# Patient Record
Sex: Female | Born: 1985 | Race: Black or African American | Hispanic: No | Marital: Single | Smoking: Former smoker
Health system: Southern US, Community
[De-identification: ages and names within clinical notes are randomized; demographics above are authoritative.]

## PROBLEM LIST (undated history)

## (undated) DIAGNOSIS — A6000 Herpesviral infection of urogenital system, unspecified: Secondary | ICD-10-CM

## (undated) DIAGNOSIS — G4700 Insomnia, unspecified: Secondary | ICD-10-CM

## (undated) DIAGNOSIS — E663 Overweight: Secondary | ICD-10-CM

## (undated) DIAGNOSIS — E282 Polycystic ovarian syndrome: Secondary | ICD-10-CM

## (undated) HISTORY — DX: Herpesviral infection of urogenital system, unspecified: A60.00

## (undated) HISTORY — DX: Insomnia, unspecified: G47.00

## (undated) HISTORY — DX: Overweight: E66.3

## (undated) HISTORY — DX: Polycystic ovarian syndrome: E28.2

---
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
## 2012-08-12 ENCOUNTER — Emergency Department: Payer: Self-pay | Admitting: Emergency Medicine

## 2012-08-12 LAB — COMPREHENSIVE METABOLIC PANEL
Alkaline Phosphatase: 104 U/L (ref 50–136)
BUN: 9 mg/dL (ref 7–18)
Bilirubin,Total: 0.4 mg/dL (ref 0.2–1.0)
Chloride: 106 mmol/L (ref 98–107)
Creatinine: 0.68 mg/dL (ref 0.60–1.30)
EGFR (African American): >60
Potassium: 3.7 mmol/L (ref 3.5–5.1)
SGPT (ALT): 32 U/L (ref 12–78)
Total Protein: 8.3 g/dL — ABNORMAL HIGH (ref 6.4–8.2)

## 2012-08-12 LAB — URINALYSIS, COMPLETE
Nitrite: NEGATIVE
Ph: 6 (ref 4.5–8.0)
Protein: NEGATIVE
Specific Gravity: 1.024 (ref 1.003–1.030)
WBC UR: 3 /HPF (ref 0–5)

## 2012-08-12 LAB — CBC
HCT: 37.2 % (ref 35.0–47.0)
HGB: 13.1 g/dL (ref 12.0–16.0)
MCH: 29.9 pg (ref 26.0–34.0)
MCV: 85 fL (ref 80–100)
Platelet: 259 10*3/uL (ref 150–440)
RBC: 4.39 10*6/uL (ref 3.80–5.20)
WBC: 12.3 10*3/uL — ABNORMAL HIGH (ref 3.6–11.0)

## 2012-12-03 LAB — HM PAP SMEAR: HM PAP: NEGATIVE

## 2013-06-03 ENCOUNTER — Emergency Department: Payer: Self-pay | Admitting: Emergency Medicine

## 2013-06-06 LAB — BETA STREP CULTURE(ARMC)

## 2013-08-30 ENCOUNTER — Emergency Department: Payer: Self-pay | Admitting: Emergency Medicine

## 2013-11-19 ENCOUNTER — Emergency Department: Payer: Self-pay | Admitting: Emergency Medicine

## 2013-11-19 LAB — COMPREHENSIVE METABOLIC PANEL
ALK PHOS: 88 U/L
Albumin: 3.4 g/dL (ref 3.4–5.0)
Anion Gap: 6 — ABNORMAL LOW (ref 7–16)
BUN: 11 mg/dL (ref 7–18)
Bilirubin,Total: 0.4 mg/dL (ref 0.2–1.0)
CO2: 27 mmol/L (ref 21–32)
Calcium, Total: 8.6 mg/dL (ref 8.5–10.1)
Chloride: 106 mmol/L (ref 98–107)
Creatinine: 0.82 mg/dL (ref 0.60–1.30)
EGFR (African American): >60
GLUCOSE: 108 mg/dL — AB (ref 65–99)
Osmolality: 277 (ref 275–301)
POTASSIUM: 4.2 mmol/L (ref 3.5–5.1)
SGOT(AST): 17 U/L (ref 15–37)
SGPT (ALT): 19 U/L (ref 12–78)
Sodium: 139 mmol/L (ref 136–145)
Total Protein: 7.8 g/dL (ref 6.4–8.2)

## 2013-11-19 LAB — URINALYSIS, COMPLETE
BILIRUBIN, UR: NEGATIVE
Blood: NEGATIVE
Glucose,UR: NEGATIVE mg/dL (ref 0–75)
KETONE: NEGATIVE
Nitrite: NEGATIVE
PH: 6 (ref 4.5–8.0)
PROTEIN: NEGATIVE
RBC,UR: 4 /HPF (ref 0–5)
Specific Gravity: 1.021 (ref 1.003–1.030)
WBC UR: 10 /HPF (ref 0–5)

## 2013-11-19 LAB — CBC WITH DIFFERENTIAL/PLATELET
BASOS ABS: 0.1 10*3/uL (ref 0.0–0.1)
Basophil %: 0.8 %
EOS ABS: 0.1 10*3/uL (ref 0.0–0.7)
EOS PCT: 0.8 %
HCT: 36.9 % (ref 35.0–47.0)
HGB: 12.6 g/dL (ref 12.0–16.0)
LYMPHS PCT: 23.5 %
Lymphocyte #: 3.6 10*3/uL (ref 1.0–3.6)
MCH: 28.8 pg (ref 26.0–34.0)
MCHC: 34.1 g/dL (ref 32.0–36.0)
MCV: 84 fL (ref 80–100)
Monocyte #: 0.7 x10 3/mm (ref 0.2–0.9)
Monocyte %: 4.7 %
Neutrophil #: 10.7 10*3/uL — ABNORMAL HIGH (ref 1.4–6.5)
Neutrophil %: 70.2 %
PLATELETS: 251 10*3/uL (ref 150–440)
RBC: 4.37 10*6/uL (ref 3.80–5.20)
RDW: 14.9 % — ABNORMAL HIGH (ref 11.5–14.5)
WBC: 15.3 10*3/uL — AB (ref 3.6–11.0)

## 2013-11-19 LAB — HCG, QUANTITATIVE, PREGNANCY: Beta Hcg, Quant.: 52 m[IU]/mL — ABNORMAL HIGH

## 2013-11-28 ENCOUNTER — Emergency Department: Payer: Self-pay | Admitting: Emergency Medicine

## 2013-12-15 ENCOUNTER — Emergency Department: Payer: Self-pay | Admitting: Emergency Medicine

## 2013-12-15 LAB — CBC
HCT: 37 % (ref 35.0–47.0)
HGB: 12.2 g/dL (ref 12.0–16.0)
MCH: 28 pg (ref 26.0–34.0)
MCHC: 32.9 g/dL (ref 32.0–36.0)
MCV: 85 fL (ref 80–100)
Platelet: 268 10*3/uL (ref 150–440)
RBC: 4.35 10*6/uL (ref 3.80–5.20)
RDW: 15.4 % — AB (ref 11.5–14.5)
WBC: 13 10*3/uL — AB (ref 3.6–11.0)

## 2013-12-15 LAB — URINALYSIS, COMPLETE
BILIRUBIN, UR: NEGATIVE
Glucose,UR: NEGATIVE mg/dL (ref 0–75)
Ketone: NEGATIVE
Leukocyte Esterase: NEGATIVE
NITRITE: NEGATIVE
PH: 7 (ref 4.5–8.0)
PROTEIN: NEGATIVE
RBC,UR: 1 /HPF (ref 0–5)
SPECIFIC GRAVITY: 1.012 (ref 1.003–1.030)

## 2013-12-15 LAB — HCG, QUANTITATIVE, PREGNANCY: BETA HCG, QUANT.: 726 m[IU]/mL — AB

## 2014-05-06 ENCOUNTER — Emergency Department: Payer: Self-pay | Admitting: Emergency Medicine

## 2014-06-09 ENCOUNTER — Emergency Department: Payer: Self-pay | Admitting: Emergency Medicine

## 2014-06-09 LAB — URINALYSIS, COMPLETE
BACTERIA: NONE SEEN
Bilirubin,UR: NEGATIVE
GLUCOSE, UR: NEGATIVE mg/dL (ref 0–75)
Ketone: NEGATIVE
Leukocyte Esterase: NEGATIVE
NITRITE: NEGATIVE
Ph: 5 (ref 4.5–8.0)
Protein: NEGATIVE
SPECIFIC GRAVITY: 1.023 (ref 1.003–1.030)

## 2014-06-09 LAB — CBC WITH DIFFERENTIAL/PLATELET
Basophil #: 0.1 10*3/uL (ref 0.0–0.1)
Basophil %: 0.7 %
Eosinophil #: 0.2 10*3/uL (ref 0.0–0.7)
Eosinophil %: 1.5 %
HCT: 38.1 % (ref 35.0–47.0)
HGB: 12.9 g/dL (ref 12.0–16.0)
LYMPHS ABS: 3.2 10*3/uL (ref 1.0–3.6)
Lymphocyte %: 27.5 %
MCH: 28.6 pg (ref 26.0–34.0)
MCHC: 33.7 g/dL (ref 32.0–36.0)
MCV: 85 fL (ref 80–100)
Monocyte #: 0.7 x10 3/mm (ref 0.2–0.9)
Monocyte %: 5.7 %
Neutrophil #: 7.6 10*3/uL — ABNORMAL HIGH (ref 1.4–6.5)
Neutrophil %: 64.6 %
PLATELETS: 259 10*3/uL (ref 150–440)
RBC: 4.5 10*6/uL (ref 3.80–5.20)
RDW: 14.5 % (ref 11.5–14.5)
WBC: 11.7 10*3/uL — AB (ref 3.6–11.0)

## 2014-06-09 LAB — GC/CHLAMYDIA PROBE AMP

## 2014-06-09 LAB — WET PREP, GENITAL

## 2015-01-23 IMAGING — CR DG CHEST 2V
1 series · 2 of 2 positions shown · non-contrast
Comparison: None.

CLINICAL DATA: Cough and fever for 6 days

EXAM:
CHEST  2 VIEW

[Series 1: pa · 0.17mm/px · 2 of 2 slices shown]
[im 1/2]
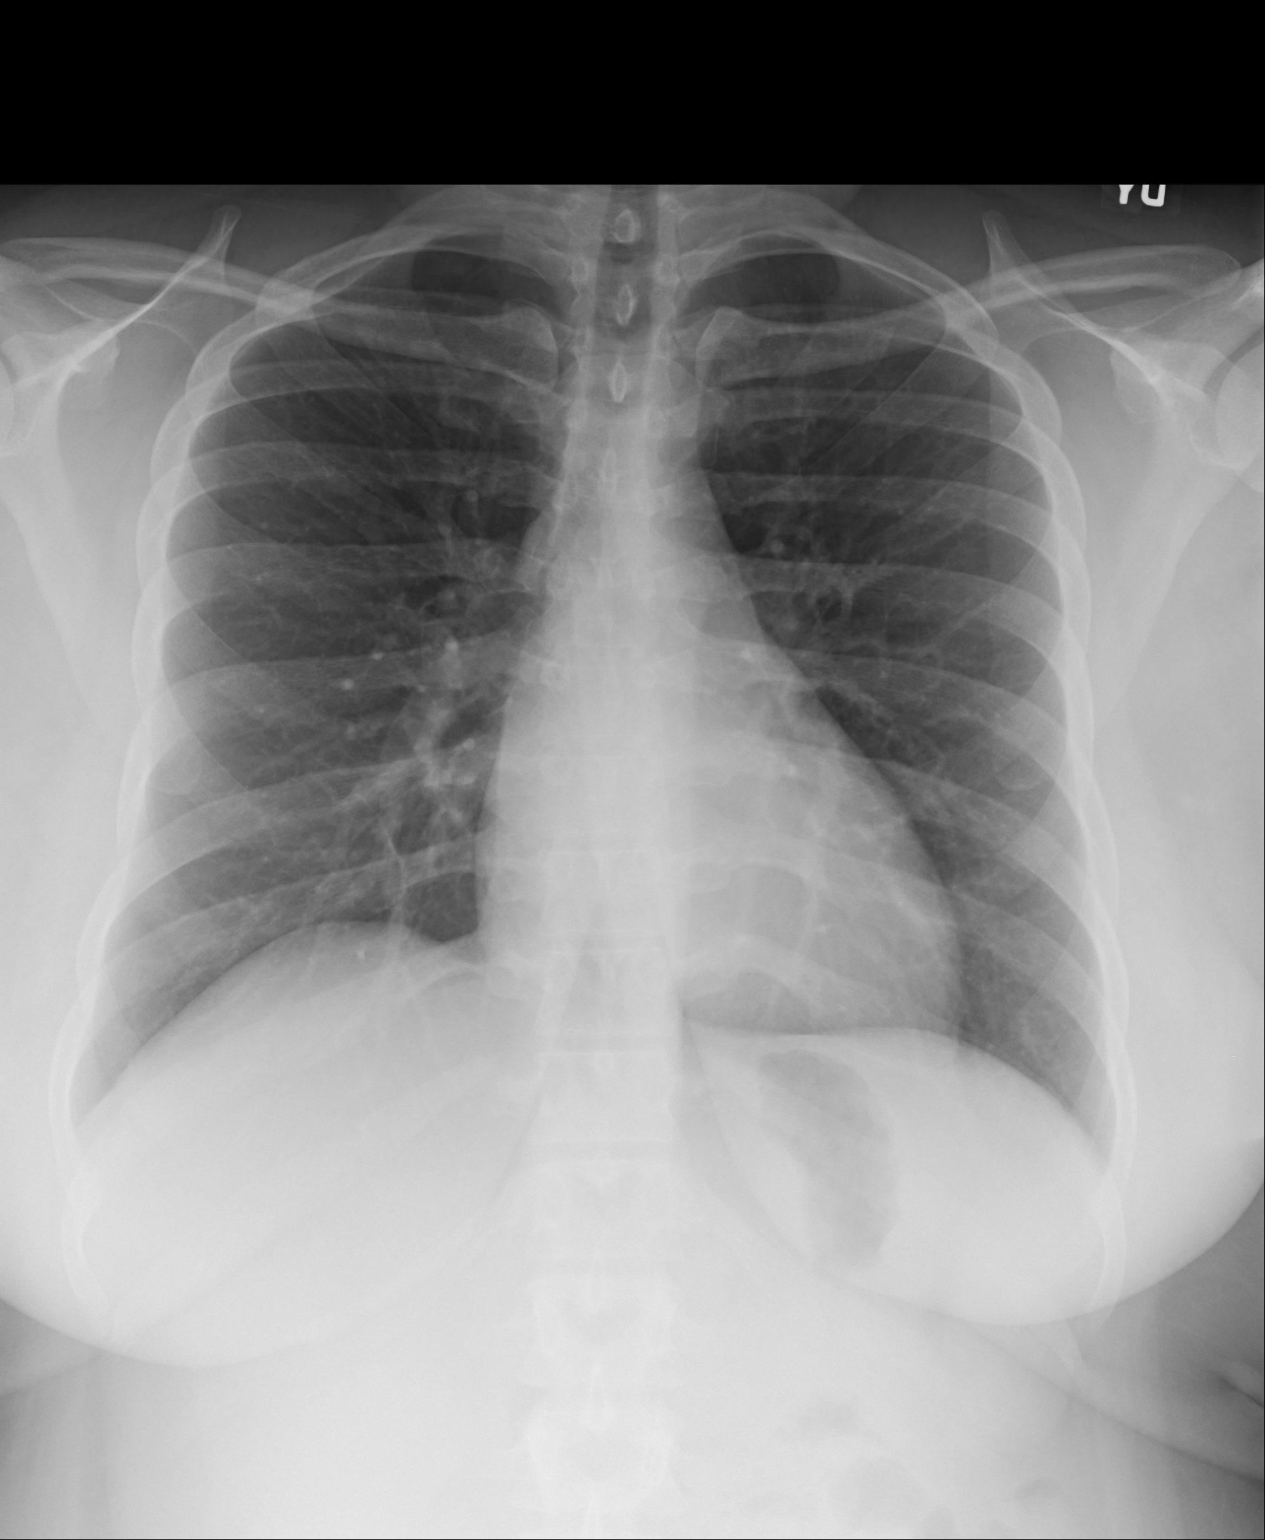
[im 2/2]
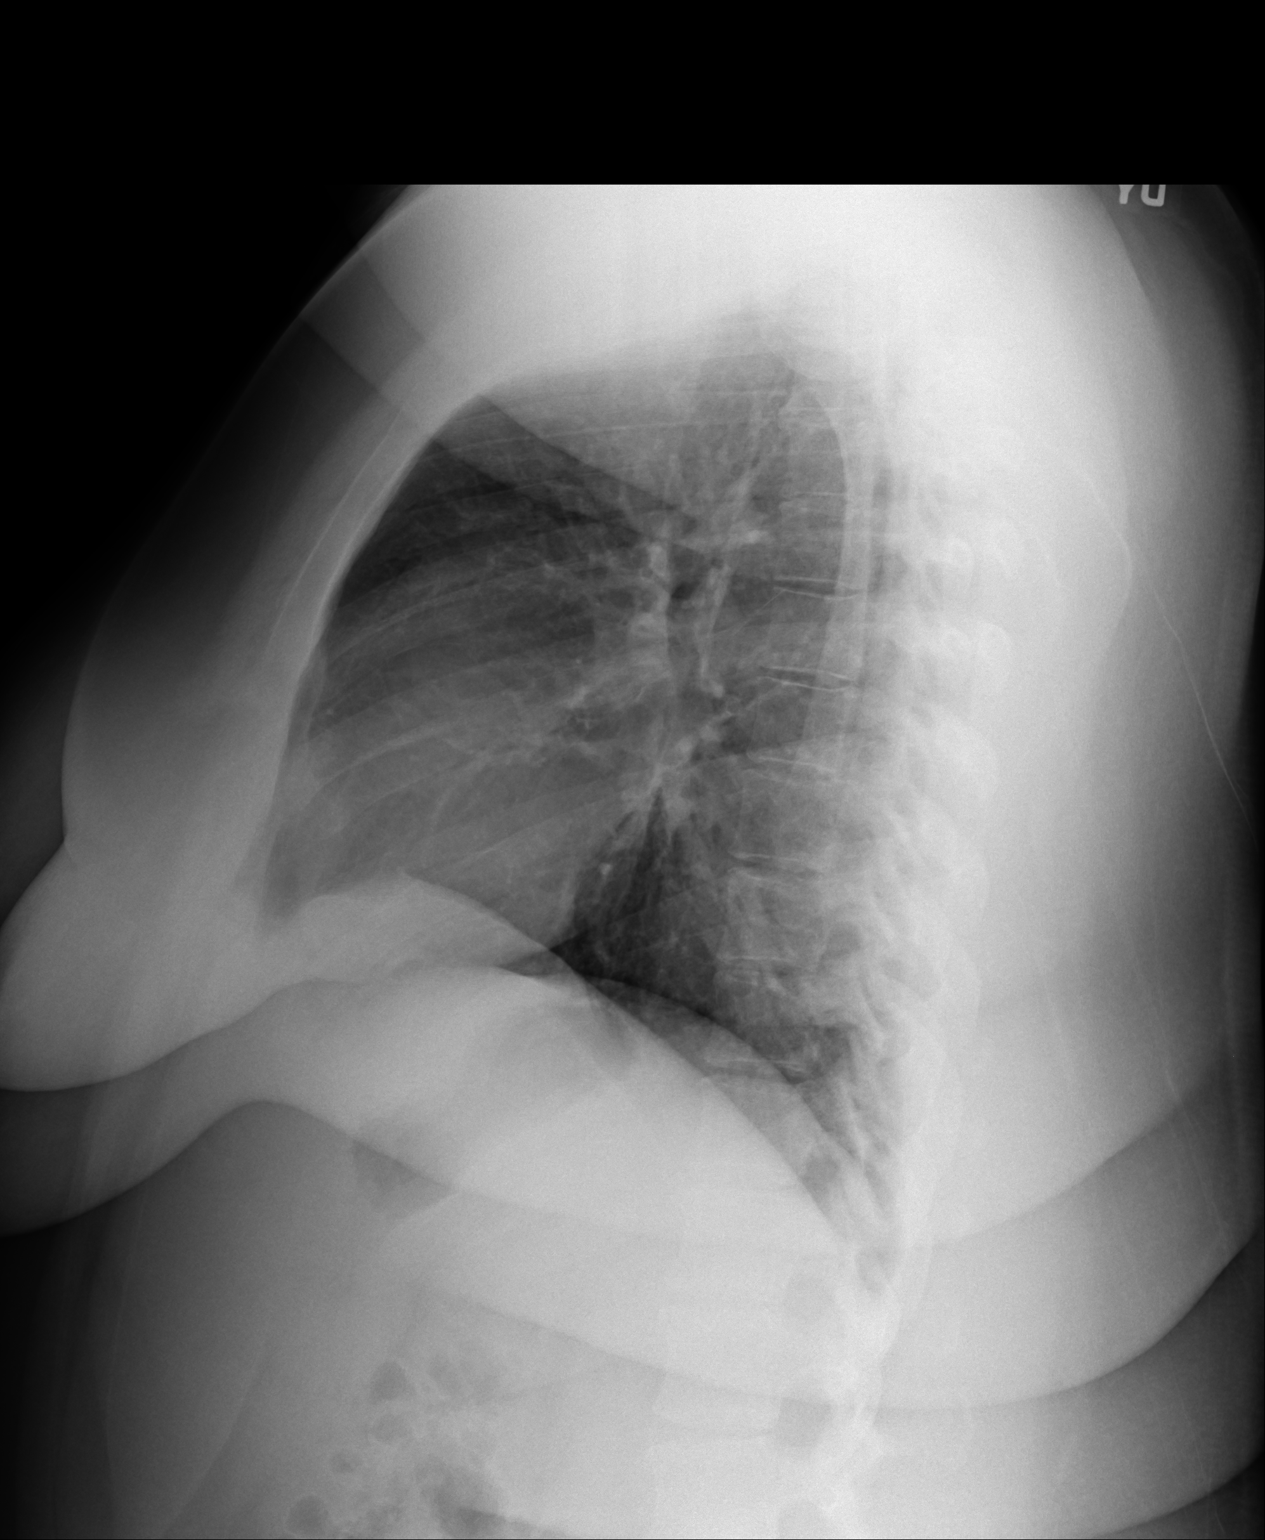

[2 of 2 positions shown; findings below may reference images not displayed]

FINDINGS: The heart size and mediastinal contours are within normal limits.
Both lungs are clear. The visualized skeletal structures are
unremarkable.
IMPRESSION: No active cardiopulmonary disease.

## 2015-04-15 ENCOUNTER — Emergency Department
Admission: EM | Admit: 2015-04-15 | Discharge: 2015-04-15 | Disposition: A | Discharge status: Home / Self Care | Payer: Self-pay | Attending: Emergency Medicine | Admitting: Emergency Medicine

## 2015-04-15 ENCOUNTER — Emergency Department: Payer: Self-pay

## 2015-04-15 DIAGNOSIS — N730 Acute parametritis and pelvic cellulitis: Secondary | ICD-10-CM | POA: Insufficient documentation

## 2015-04-15 DIAGNOSIS — R102 Pelvic and perineal pain: Secondary | ICD-10-CM

## 2015-04-15 DIAGNOSIS — Z720 Tobacco use: Secondary | ICD-10-CM | POA: Insufficient documentation

## 2015-04-15 DIAGNOSIS — Z3202 Encounter for pregnancy test, result negative: Secondary | ICD-10-CM | POA: Insufficient documentation

## 2015-04-15 DIAGNOSIS — B379 Candidiasis, unspecified: Secondary | ICD-10-CM

## 2015-04-15 DIAGNOSIS — B373 Candidiasis of vulva and vagina: Secondary | ICD-10-CM | POA: Insufficient documentation

## 2015-04-15 LAB — URINALYSIS COMPLETE WITH MICROSCOPIC (ARMC ONLY)
BILIRUBIN URINE: NEGATIVE
Glucose, UA: NEGATIVE mg/dL
Hgb urine dipstick: NEGATIVE
KETONES UR: NEGATIVE mg/dL
Leukocytes, UA: NEGATIVE
NITRITE: NEGATIVE
PROTEIN: NEGATIVE mg/dL
Specific Gravity, Urine: 1.026 (ref 1.005–1.030)
pH: 6 (ref 5.0–8.0)

## 2015-04-15 LAB — CBC
HCT: 39.3 % (ref 35.0–47.0)
HEMOGLOBIN: 13.2 g/dL (ref 12.0–16.0)
MCH: 28.1 pg (ref 26.0–34.0)
MCHC: 33.5 g/dL (ref 32.0–36.0)
MCV: 83.7 fL (ref 80.0–100.0)
Platelets: 255 10*3/uL (ref 150–440)
RBC: 4.69 MIL/uL (ref 3.80–5.20)
RDW: 14.9 % — AB (ref 11.5–14.5)
WBC: 14.5 10*3/uL — ABNORMAL HIGH (ref 3.6–11.0)

## 2015-04-15 LAB — WET PREP, GENITAL
CLUE CELLS WET PREP: NONE SEEN
TRICH WET PREP: NONE SEEN

## 2015-04-15 LAB — BASIC METABOLIC PANEL
Anion gap: 7 (ref 5–15)
BUN: 11 mg/dL (ref 6–20)
CALCIUM: 9.1 mg/dL (ref 8.9–10.3)
CHLORIDE: 106 mmol/L (ref 101–111)
CO2: 26 mmol/L (ref 22–32)
CREATININE: 0.89 mg/dL (ref 0.44–1.00)
GFR calc non Af Amer: >60 mL/min (ref >60)
Glucose, Bld: 166 mg/dL — ABNORMAL HIGH (ref 65–99)
Potassium: 3.8 mmol/L (ref 3.5–5.1)
SODIUM: 139 mmol/L (ref 135–145)

## 2015-04-15 LAB — CHLAMYDIA/NGC RT PCR (ARMC ONLY)
CHLAMYDIA TR: NOT DETECTED
N GONORRHOEAE: NOT DETECTED

## 2015-04-15 LAB — PREGNANCY, URINE: Preg Test, Ur: NEGATIVE

## 2015-04-15 MED ORDER — FLUCONAZOLE 50 MG PO TABS
150.0000 mg | ORAL_TABLET | Freq: Once | ORAL | Status: AC
  Administered 2015-04-15: 150 mg via ORAL
  Filled 2015-04-15: qty 1

## 2015-04-15 MED ORDER — CEFTRIAXONE SODIUM 250 MG IJ SOLR
250.0000 mg | Freq: Once | INTRAMUSCULAR | Status: AC
  Administered 2015-04-15: 250 mg via INTRAMUSCULAR
  Filled 2015-04-15: qty 250

## 2015-04-15 MED ORDER — LIDOCAINE HCL (PF) 1 % IJ SOLN
0.9000 mL | Freq: Once | INTRAMUSCULAR | Status: AC
  Administered 2015-04-15: 0.9 mL

## 2015-04-15 MED ORDER — ONDANSETRON 4 MG PO TBDP
4.0000 mg | ORAL_TABLET | Freq: Once | ORAL | Status: AC
  Administered 2015-04-15: 4 mg via ORAL
  Filled 2015-04-15: qty 1

## 2015-04-15 MED ORDER — OXYCODONE-ACETAMINOPHEN 5-325 MG PO TABS
1.0000 | ORAL_TABLET | Freq: Once | ORAL | Status: AC
Start: 2015-04-15 — End: 2015-04-15
  Administered 2015-04-15: 1 via ORAL
  Filled 2015-04-15: qty 1

## 2015-04-15 MED ORDER — LIDOCAINE HCL (PF) 1 % IJ SOLN
INTRAMUSCULAR | Status: AC
  Administered 2015-04-15: 0.9 mL
  Filled 2015-04-15: qty 5

## 2015-04-15 MED ORDER — AZITHROMYCIN 1 G PO PACK
1.0000 g | PACK | Freq: Once | ORAL | Status: AC
  Administered 2015-04-15: 1 g via ORAL
  Filled 2015-04-15: qty 1

## 2015-04-15 MED ORDER — DOXYCYCLINE HYCLATE 50 MG PO CAPS: 100.0000 mg | ORAL_CAPSULE | Freq: Two times a day (BID) | ORAL | Status: AC

## 2015-04-15 NOTE — Discharge Instructions (Signed)
Pelvic Inflammatory Disease °Pelvic inflammatory disease (PID) refers to an infection in some or all of the female organs. The infection can be in the uterus, ovaries, fallopian tubes, or the surrounding tissues in the pelvis. PID can cause abdominal or pelvic pain that comes on suddenly (acute pelvic pain). PID is a serious infection because it can lead to lasting (chronic) pelvic pain or the inability to have children (infertile).  °CAUSES  °The infection is often caused by the normal bacteria found in the vaginal tissues. PID may also be caused by an infection that is spread during sexual contact. PID can also occur following:  °· The birth of a baby.   °· A miscarriage.   °· An abortion.   °· Major pelvic surgery.   °· The use of an intrauterine device (IUD).   °· A sexual assault.   °RISK FACTORS °Certain factors can put a person at higher risk for PID, such as: °· Being younger than 25 years. °· Being sexually active at a young age. °· Using nonbarrier contraception. °· Having multiple sexual partners. °· Having sex with someone who has symptoms of a genital infection. °· Using oral contraception. °Other times, certain behaviors can increase the possibility of getting PID, such as: °· Having sex during your period. °· Using a vaginal douche. °· Having an intrauterine device (IUD) in place. °SYMPTOMS  °· Abdominal or pelvic pain.   °· Fever.   °· Chills.   °· Abnormal vaginal discharge. °· Abnormal uterine bleeding.   °· Unusual pain shortly after finishing your period. °DIAGNOSIS  °Your caregiver will choose some of the following methods to make a diagnosis, such as:  °· Performing a physical exam and history. A pelvic exam typically reveals a very tender uterus and surrounding pelvis.   °· Ordering laboratory tests including a pregnancy test, blood tests, and urine test.  °· Ordering cultures of the vagina and cervix to check for a sexually transmitted infection (STI). °· Performing an ultrasound.    °· Performing a laparoscopic procedure to look inside the pelvis.   °TREATMENT  °· Antibiotic medicines may be prescribed and taken by mouth.   °· Sexual partners may be treated when the infection is caused by a sexually transmitted disease (STD).   °· Hospitalization may be needed to give antibiotics intravenously. °· Surgery may be needed, but this is rare. °It may take weeks until you are completely well. If you are diagnosed with PID, you should also be checked for human immunodeficiency virus (HIV).   °HOME CARE INSTRUCTIONS  °· If given, take your antibiotics as directed. Finish the medicine even if you start to feel better.   °· Only take over-the-counter or prescription medicines for pain, discomfort, or fever as directed by your caregiver.   °· Do not have sexual intercourse until treatment is completed or as directed by your caregiver. If PID is confirmed, your recent sexual partner(s) will need treatment.   °· Keep your follow-up appointments. °SEEK MEDICAL CARE IF:  °· You have increased or abnormal vaginal discharge.   °· You need prescription medicine for your pain.   °· You vomit.   °· You cannot take your medicines.   °· Your partner has an STD.   °SEEK IMMEDIATE MEDICAL CARE IF:  °· You have a fever.   °· You have increased abdominal or pelvic pain.   °· You have chills.   °· You have pain when you urinate.   °· You are not better after 72 hours following treatment.   °MAKE SURE YOU:  °· Understand these instructions. °· Will watch your condition. °· Will get help right away if you are not doing well or get worse. °  Document Released: 08/14/2005 Document Revised: 12/09/2012 Document Reviewed: 08/10/2011 °[employer]® Patient Information ©2015 [employer]. This information is not intended to replace advice given to you by your health care provider. Make sure you discuss any questions you have with your health care provider. ° °

## 2015-04-15 NOTE — ED Notes (Signed)
Pt in with co pelvic pain radiates to rectal area worse during intercourse.

## 2015-04-15 NOTE — ED Provider Notes (Signed)
[facility] Emergency Department Provider Note  ____________________________________________  Time seen: 3:08 AM  I have reviewed the triage vital signs and the nursing notes.   HISTORY  Chief Complaint Abdominal Pain     HPI Colleen Ryan is a 29 y.o. female presents with pelvic pain times one day. Patient states the pain is worse with walking and sexual intercourse   Past medical history Polycystic ovarian syndrome There are no active problems to display for this patient.   Past surgical history None No current outpatient prescriptions on file.  Allergies Bactrim  History reviewed. No pertinent family history.  Social History Social History  Substance Use Topics  . Smoking status: Current Every Day Smoker  . Smokeless tobacco: None  . Alcohol Use: None    Review of Systems  Constitutional: Negative for fever. Eyes: Negative for visual changes. ENT: Negative for sore throat. Cardiovascular: Negative for chest pain. Respiratory: Negative for shortness of breath. Gastrointestinal: Negative for abdominal pain, vomiting and diarrhea. Genitourinary: Negative for dysuria. Positive pelvic pain Musculoskeletal: Negative for back pain. Skin: Negative for rash. Neurological: Negative for headaches, focal weakness or numbness.   10-point ROS otherwise negative.  ____________________________________________   PHYSICAL EXAM:  VITAL SIGNS: ED Triage Vitals  Enc Vitals Group     BP 04/15/15 0150 136/77 mmHg     Pulse Rate 04/15/15 0150 107     Resp 04/15/15 0150 18     Temp 04/15/15 0150 98.5 F (36.9 C)     Temp Source 04/15/15 0150 Oral     SpO2 04/15/15 0150 98 %     Weight 04/15/15 0150 245 lb (111.131 kg)     Height 04/15/15 0150  (1.702 m)     Head Cir --      Peak Flow --      Pain Score 04/15/15 0151 9     Pain Loc --      Pain Edu? --      Excl. in GC? --      Constitutional: Alert and oriented. Well  appearing and in no distress. Eyes: Conjunctivae are normal. PERRL. Normal extraocular movements. ENT   Head: Normocephalic and atraumatic.   Nose: No congestion/rhinnorhea.   Mouth/Throat: Mucous membranes are moist.   Neck: No stridor. Hematological/Lymphatic/Immunilogical: No cervical lymphadenopathy. Cardiovascular: Normal rate, regular rhythm. Normal and symmetric distal pulses are present in all extremities. No murmurs, rubs, or gallops. Respiratory: Normal respiratory effort without tachypnea nor retractions. Breath sounds are clear and equal bilaterally. No wheezes/rales/rhonchi. Gastrointestinal: Soft and nontender. No distention. There is no CVA tenderness. Tenderness to palpation suprapubic and left pelvis Genitourinary: deferred Musculoskeletal: Nontender with normal range of motion in all extremities. No joint effusions.  No lower extremity tenderness nor edema. Neurologic:  Normal speech and language. No gross focal neurologic deficits are appreciated. Speech is normal.  Skin:  Skin is warm, dry and intact. No rash noted. Psychiatric: Mood and affect are normal. Speech and behavior are normal. Patient exhibits appropriate insight and judgment.  ____________________________________________    LABS (pertinent positives/negatives)  Labs Reviewed  CBC - Abnormal; Notable for the following:    WBC 14.5 (*)    RDW 14.9 (*)    All other components within normal limits  BASIC METABOLIC PANEL - Abnormal; Notable for the following:    Glucose, Bld 166 (*)    All other components within normal limits  URINALYSIS COMPLETEWITH MICROSCOPIC ([facility] ONLY) - Abnormal; Notable for the following:    Color,  Urine YELLOW (*)    APPearance CLEAR (*)    Bacteria, UA RARE (*)    Squamous Epithelial / LPF 0-5 (*)    All other components within normal limits  CHLAMYDIA/NGC RT PCR ([facility] ONLY)  WET PREP, GENITAL  PREGNANCY, URINE       RADIOLOGY  Ultrasound pelvis  revealed    US Transvaginal Non-OB (Final result) Result time: 04/15/15 05:22:56   Final result by Rad Results In Interface (04/15/15 05:22:56)   Narrative:   CLINICAL DATA: Left pelvic pain for 3 days  EXAM: TRANSABDOMINAL AND TRANSVAGINAL ULTRASOUND OF PELVIS  TECHNIQUE: Both transabdominal and transvaginal ultrasound examinations of the pelvis were performed. Transabdominal technique was performed for global imaging of the pelvis including uterus, ovaries, adnexal regions, and pelvic cul-de-sac. It was necessary to proceed with endovaginal exam following the transabdominal exam to visualize the ovaries and endometrium.  COMPARISON: None  FINDINGS: Uterus  Measurements: 6 x 4 x 4 cm. No fibroids or other mass visualized.  Endometrium  Thickness: 10 mm. No focal abnormality visualized.  Right ovary  Measurements: 28 x 27 x 23 mm. Normal appearance/no adnexal mass.  Left ovary  Measurements: 40 x 30 x 30 mm. There is likely a corpus luteum centrally. Small simple appearing free fluid around the left ovary.  Other findings  No free fluid.  IMPRESSION: Small free fluid around the left ovary, likely recent follicle rupture.   Electronically Signed By: Marnee Spring M.D. On: 04/15/19       INITIAL IMPRESSION / ASSESSMENT AND PLAN / ED COURSE  Pertinent labs & imaging results that were available during my care of the patient were reviewed by me and considered in my medical decision making (see chart for details).    ____________________________________________   FINAL CLINICAL IMPRESSION(S) / ED DIAGNOSES  Final diagnoses:  PID (acute pelvic inflammatory disease)  Yeast infection      Darci Current, MD 04/15/15 [phone]

## 2015-04-15 NOTE — ED Notes (Signed)
Pt to ultrasound

## 2015-04-15 NOTE — ED Notes (Signed)
Pt returned from ultrasound

## 2015-04-15 NOTE — ED Notes (Signed)
Dr. Brown at bedside

## 2015-11-01 ENCOUNTER — Encounter: Payer: Self-pay | Admitting: *Deleted

## 2015-11-03 ENCOUNTER — Ambulatory Visit (INDEPENDENT_AMBULATORY_CARE_PROVIDER_SITE_OTHER): Payer: BLUE CROSS/BLUE SHIELD | Admitting: Obstetrics and Gynecology

## 2015-11-03 ENCOUNTER — Encounter: Payer: Self-pay | Admitting: Obstetrics and Gynecology

## 2015-11-03 VITALS — BP 123/74 | HR 81 | Ht 67.0 in | Wt 264.9 lb

## 2015-11-03 DIAGNOSIS — B3731 Acute candidiasis of vulva and vagina: Secondary | ICD-10-CM

## 2015-11-03 DIAGNOSIS — Z8742 Personal history of other diseases of the female genital tract: Secondary | ICD-10-CM | POA: Diagnosis not present

## 2015-11-03 DIAGNOSIS — N926 Irregular menstruation, unspecified: Secondary | ICD-10-CM | POA: Diagnosis not present

## 2015-11-03 DIAGNOSIS — B373 Candidiasis of vulva and vagina: Secondary | ICD-10-CM | POA: Diagnosis not present

## 2015-11-03 LAB — POCT URINE PREGNANCY: PREG TEST UR: NEGATIVE

## 2015-11-03 MED ORDER — METFORMIN HCL 500 MG PO TABS: 500.0000 mg | ORAL_TABLET | Freq: Two times a day (BID) | ORAL | Status: DC

## 2015-11-03 MED ORDER — TERCONAZOLE 0.4 % VA CREA: 1.0000 | TOPICAL_CREAM | Freq: Every day | VAGINAL | Status: DC

## 2015-11-03 NOTE — Progress Notes (Signed)
Subjective:     Patient ID: Colleen Ryan, female   DOB: Dec 31, 1985, 30 y.o.   MRN: [id number]  HPI H/O PCOS- reports not taking metformin regularly, and last year menses have been irregular in timing. Not using any BC as desires a pregnancy. Has had recurrent yeast infections and occasional pelvic pains. Seen in ED in August. Currently sexually active without complaint.  Review of Systems Currently late for menses after bleeding lightly x 14 days in Feb.  Vaginal itching daily x 10days after wiping or washing. Weight gain of 20# in last 4 months No appetite    Objective:   Physical Exam A&O x4  well groomed obese female in no distress Blood pressure 123/74, pulse 81, height 5\' 7"  (1.702 m), weight 264 lb 14.4 oz (120.158 kg), last menstrual period 10/03/2015. Pelvic exam: normal external genitalia, vulva, vagina, cervix, uterus and adnexa, WET MOUNT done - results: negative for pathogens, normal epithelial cells, lactobacilli.    Assessment:     PCOS Irregular menses  yeast infection obesity     Plan:     Labs for PCOS drawn today Encouraged taking metformin as prescribed and new RX sent in terazol 7 RX sent in and instructed on use  reminded need for exercise and increased water intake Will consider restarting Yasmin OCP and increasing metformin when lab results return if indicated. May also consider referral to endocrinologist. Will return in 3-4 weeks for annual exam as it is past due.  Damarea Merkel [place], CNM

## 2015-11-04 ENCOUNTER — Telehealth: Payer: Self-pay | Admitting: Obstetrics and Gynecology

## 2015-11-04 NOTE — Telephone Encounter (Signed)
Her medications that were prescribed yesterday are not at pharmacy. [facility] [address]

## 2015-11-05 LAB — DHEA-SULFATE: DHEA-SO4: 248.2 ug/dL (ref 84.8–378.0)

## 2015-11-05 LAB — CBC
Hematocrit: 38.5 % (ref 34.0–46.6)
Hemoglobin: 13 g/dL (ref 11.1–15.9)
MCH: 29.3 pg (ref 26.6–33.0)
MCHC: 33.8 g/dL (ref 31.5–35.7)
MCV: 87 fL (ref 79–97)
PLATELETS: 257 10*3/uL (ref 150–379)
RBC: 4.43 x10E6/uL (ref 3.77–5.28)
RDW: 16.1 % — AB (ref 12.3–15.4)
WBC: 9.8 10*3/uL (ref 3.4–10.8)

## 2015-11-05 LAB — COMPREHENSIVE METABOLIC PANEL
A/G RATIO: 1.3 (ref 1.1–2.5)
ALK PHOS: 96 IU/L (ref 39–117)
ALT: 22 IU/L (ref 0–32)
AST: 21 IU/L (ref 0–40)
Albumin: 4.2 g/dL (ref 3.5–5.5)
BILIRUBIN TOTAL: 0.4 mg/dL (ref 0.0–1.2)
BUN/Creatinine Ratio: 21 — ABNORMAL HIGH (ref 8–20)
BUN: 15 mg/dL (ref 6–20)
CHLORIDE: 102 mmol/L (ref 96–106)
CO2: 22 mmol/L (ref 18–29)
Calcium: 9.2 mg/dL (ref 8.7–10.2)
Creatinine, Ser: 0.71 mg/dL (ref 0.57–1.00)
GFR calc Af Amer: 135 mL/min/{1.73_m2} (ref >59)
GFR, EST NON AFRICAN AMERICAN: 117 mL/min/{1.73_m2} (ref >59)
GLOBULIN, TOTAL: 3.2 g/dL (ref 1.5–4.5)
Glucose: 104 mg/dL — ABNORMAL HIGH (ref 65–99)
POTASSIUM: 4 mmol/L (ref 3.5–5.2)
SODIUM: 140 mmol/L (ref 134–144)
Total Protein: 7.4 g/dL (ref 6.0–8.5)

## 2015-11-05 LAB — THYROID PANEL WITH TSH
Free Thyroxine Index: 2.3 (ref 1.2–4.9)
T3 UPTAKE RATIO: 25 % (ref 24–39)
T4 TOTAL: 9.3 ug/dL (ref 4.5–12.0)
TSH: 1.51 u[IU]/mL (ref 0.450–4.500)

## 2015-11-05 LAB — BETA HCG QUANT (REF LAB): hCG Quant: <1 m[IU]/mL

## 2015-11-05 LAB — PROGESTERONE: Progesterone: 1.2 ng/mL

## 2015-11-05 LAB — TESTOSTERONE, FREE, TOTAL, SHBG
Sex Hormone Binding: 28.4 nmol/L (ref 24.6–122.0)
TESTOSTERONE FREE: 1.7 pg/mL (ref 0.0–4.2)
TESTOSTERONE: 26 ng/dL (ref 8–48)

## 2015-11-05 LAB — HEMOGLOBIN A1C
ESTIMATED AVERAGE GLUCOSE: 108 mg/dL
HEMOGLOBIN A1C: 5.4 % (ref 4.8–5.6)

## 2015-11-05 LAB — ESTRADIOL: Estradiol: 74.6 pg/mL

## 2015-11-05 LAB — INSULIN, RANDOM: INSULIN: 92.7 u[IU]/mL — AB (ref 2.6–24.9)

## 2015-11-05 LAB — FSH/LH
FSH: 3 m[IU]/mL
LH: 5.4 m[IU]/mL

## 2015-11-05 LAB — FERRITIN: FERRITIN: 177 ng/mL — AB (ref 15–150)

## 2015-11-05 LAB — PROLACTIN: Prolactin: 34.6 ng/mL — ABNORMAL HIGH (ref 4.8–23.3)

## 2015-11-09 ENCOUNTER — Other Ambulatory Visit: Payer: Self-pay | Admitting: Obstetrics and Gynecology

## 2015-11-09 DIAGNOSIS — E229 Hyperfunction of pituitary gland, unspecified: Principal | ICD-10-CM

## 2015-11-09 DIAGNOSIS — E161 Other hypoglycemia: Secondary | ICD-10-CM

## 2015-11-09 DIAGNOSIS — R7989 Other specified abnormal findings of blood chemistry: Secondary | ICD-10-CM

## 2015-11-15 NOTE — Telephone Encounter (Signed)
Done-ac 

## 2015-12-02 ENCOUNTER — Emergency Department
Admission: EM | Admit: 2015-12-02 | Discharge: 2015-12-02 | Disposition: A | Discharge status: Home / Self Care | Payer: BLUE CROSS/BLUE SHIELD | Attending: Emergency Medicine | Admitting: Emergency Medicine

## 2015-12-02 DIAGNOSIS — H6691 Otitis media, unspecified, right ear: Secondary | ICD-10-CM | POA: Diagnosis not present

## 2015-12-02 DIAGNOSIS — E282 Polycystic ovarian syndrome: Secondary | ICD-10-CM | POA: Insufficient documentation

## 2015-12-02 DIAGNOSIS — J069 Acute upper respiratory infection, unspecified: Secondary | ICD-10-CM | POA: Diagnosis not present

## 2015-12-02 DIAGNOSIS — R0981 Nasal congestion: Secondary | ICD-10-CM | POA: Diagnosis present

## 2015-12-02 DIAGNOSIS — F17200 Nicotine dependence, unspecified, uncomplicated: Secondary | ICD-10-CM | POA: Insufficient documentation

## 2015-12-02 MED ORDER — PSEUDOEPH-BROMPHEN-DM 30-2-10 MG/5ML PO SYRP: 10.0000 mL | ORAL_SOLUTION | Freq: Four times a day (QID) | ORAL | Status: DC | PRN

## 2015-12-02 MED ORDER — FLUTICASONE PROPIONATE 50 MCG/ACT NA SUSP: 2.0000 | Freq: Every day | NASAL | Status: DC

## 2015-12-02 MED ORDER — AMOXICILLIN 875 MG PO TABS: 875.0000 mg | ORAL_TABLET | Freq: Two times a day (BID) | ORAL | Status: DC

## 2015-12-02 NOTE — ED Notes (Signed)
Pt in with co sinus congestion and pressure since yest.

## 2015-12-02 NOTE — ED Provider Notes (Signed)
[facility] Emergency Department Provider Note  ____________________________________________  Time seen: Approximately 7:24 AM  I have reviewed the triage vital signs and the nursing notes.   HISTORY  Chief Complaint Nasal Congestion    HPI Colleen Ryan is a 30 y.o. female , NAD, presents emergency department with 3-4 days of nasal congestion, sinus pressure and headache. Has had cough and chest congestion over the last 24 hours. Has been taking over-the-counter medications without relief of illness. Has had bilateral ear pressure with some increasing pain to the right. No discharge from ears. No changes in hearing. Has not had any changes in vision, discharge from the eyes. No fevers, chills, body aches but has had some fatigue.   Past Medical History  Diagnosis Date  . Polycystic ovarian disease   . Genital herpes   . Insomnia     There are no active problems to display for this patient.   No past surgical history on file.  Current Outpatient Rx  Name  Route  Sig  Dispense  Refill  . amoxicillin (AMOXIL) 875 MG tablet   Oral   Take 1 tablet (875 mg total) by mouth 2 (two) times daily.   20 tablet   0   . brompheniramine-pseudoephedrine-DM 30-2-10 MG/5ML syrup   Oral   Take 10 mLs by mouth 4 (four) times daily as needed.   200 mL   0   . fluticasone (FLONASE) 50 MCG/ACT nasal spray   Each Nare   Place 2 sprays into both nostrils daily.   16 g   0   . metFORMIN (GLUCOPHAGE) 500 MG tablet   Oral   Take 1 tablet (500 mg total) by mouth 2 (two) times daily with a meal.   60 tablet   6   . terconazole (TERAZOL 7) 0.4 % vaginal cream   Vaginal   Place 1 applicator vaginally at bedtime.   45 g   0     Allergies Bactrim  No family history on file.  Social History Social History  Substance Use Topics  . Smoking status: Current Every Day Smoker  . Smokeless tobacco: Not on file  . Alcohol Use: Not on file     Review of  Systems  Constitutional: No fever/chills. Positive fatigue. Eyes: No visual changes. No discharge, redness, swelling. ENT: Positive nasal congestion, runny nose, sinus pressure, bilateral ear pain. No sore throat. Cardiovascular: No chest pain. Respiratory: Positive chest congestion, cough. No shortness of breath. No wheezing.  Gastrointestinal: No abdominal pain.  No nausea, vomiting.  Musculoskeletal: Negative for general myalgias.  Skin: Negative for rash. Neurological: Positive for headaches, but no focal weakness or numbness. 10-point ROS otherwise negative.  ____________________________________________   PHYSICAL EXAM:  VITAL SIGNS: ED Triage Vitals  Enc Vitals Group     BP 12/02/15 0656 134/77 mmHg     Pulse Rate 12/02/15 0655 108     Resp 12/02/15 0655 18     Temp 12/02/15 0655 98.7 F (37.1 C)     Temp Source 12/02/15 0655 Oral     SpO2 12/02/15 0655 96 %     Weight 12/02/15 0655 260 lb (117.935 kg)     Height 12/02/15 0655 5\' 5"  (1.651 m)     Head Cir --      Peak Flow --      Pain Score 12/02/15 0655 7     Pain Loc --      Pain Edu? --  Excl. in GC? --     Constitutional: Alert and oriented. Well appearing and in no acute distress. Eyes: Conjunctivae are normal. PERRL. EOMI without pain.  Head: Atraumatic. ENT:      Ears: Right TM visualized with moderate erythema and bulging with mild effusion. No perforation. Light reflex them. Left TM visualized with mild serous effusion but no erythema, bulging, perforation. Light reflex within normal limits. Bilateral external ear canals without erythema, swelling, discharge.      Nose: Mild congestion with clear rhinnorhea.      Mouth/Throat: Mucous membranes are moist. Pharynx without erythema, swelling, exudate. White postnasal drip. Neck: Supple with full range of motion. Hematological/Lymphatic/Immunilogical: No cervical lymphadenopathy. Cardiovascular: Normal rate, regular rhythm. Normal S1 and S2.  Good  peripheral circulation. Respiratory: Normal respiratory effort without tachypnea or retractions. Lungs CTAB. Neurologic:  Normal speech and language. No gross focal neurologic deficits are appreciated.  Skin:  Skin is warm, dry and intact. No rash noted. Psychiatric: Mood and affect are normal. Speech and behavior are normal. Patient exhibits appropriate insight and judgement.   ____________________________________________   LABS  None  ____________________________________________  EKG  None ____________________________________________  RADIOLOGY  None ____________________________________________    PROCEDURES  Procedure(s) performed: None    Medications - No data to display   ____________________________________________   INITIAL IMPRESSION / ASSESSMENT AND PLAN / ED COURSE  Patient's diagnosis is consistent with acute right otitis media in the setting of an upper respiratory infection. Patient will be discharged home with prescriptions for amoxicillin, Bromfed-DM, Flonase to take as directed.  May continue to take Tylenol or ibuprofen as needed for fever or pain. Patient is to follow up with primary care provider or [facility] if symptoms persist past this treatment course. Patient is given ED precautions to return to the ED for any worsening or new symptoms.    ____________________________________________  FINAL CLINICAL IMPRESSION(S) / ED DIAGNOSES  Final diagnoses:  Acute right otitis media, recurrence not specified, unspecified otitis media type  Upper respiratory infection      NEW MEDICATIONS STARTED DURING THIS VISIT:  Discharge Medication List as of 12/02/2015  7:51 AM    START taking these medications   Details  amoxicillin (AMOXIL) 875 MG tablet Take 1 tablet (875 mg total) by mouth 2 (two) times daily., Starting 12/02/2015, Until Discontinued, Print    brompheniramine-pseudoephedrine-DM 30-2-10 MG/5ML syrup Take 10 mLs by mouth 4  (four) times daily as needed., Starting 12/02/2015, Until Discontinued, Print    fluticasone (FLONASE) 50 MCG/ACT nasal spray Place 2 sprays into both nostrils daily., Starting 12/02/2015, Until Discontinued, Print             Hope Pigeon, PA-C 12/02/15 [id number]  Sharyn Creamer, MD 12/02/15 1544

## 2015-12-02 NOTE — ED Notes (Signed)
States she developed nasal congestion and sinus pressure about 3 days ago.  Now states congestion is in chest   Afebrile on arrival

## 2015-12-02 NOTE — Discharge Instructions (Signed)
Otitis Media, Adult Otitis media is redness, soreness, and inflammation of the middle ear. Otitis media may be caused by allergies or, most commonly, by infection. Often it occurs as a complication of the common cold. SIGNS AND SYMPTOMS Symptoms of otitis media may include:  Earache.  Fever.  Ringing in your ear.  Headache.  Leakage of fluid from the ear. DIAGNOSIS To diagnose otitis media, your health care provider will examine your ear with an otoscope. This is an instrument that allows your health care provider to see into your ear in order to examine your eardrum. Your health care provider also will ask you questions about your symptoms. TREATMENT  Typically, otitis media resolves on its own within 3-5 days. Your health care provider may prescribe medicine to ease your symptoms of pain. If otitis media does not resolve within 5 days or is recurrent, your health care provider may prescribe antibiotic medicines if he or she suspects that a bacterial infection is the cause. HOME CARE INSTRUCTIONS   If you were prescribed an antibiotic medicine, finish it all even if you start to feel better.  Take medicines only as directed by your health care provider.  Keep all follow-up visits as directed by your health care provider. SEEK MEDICAL CARE IF:  You have otitis media only in one ear, or bleeding from your nose, or both.  You notice a lump on your neck.  You are not getting better in 3-5 days.  You feel worse instead of better. SEEK IMMEDIATE MEDICAL CARE IF:   You have pain that is not controlled with medicine.  You have swelling, redness, or pain around your ear or stiffness in your neck.  You notice that part of your face is paralyzed.  You notice that the bone behind your ear (mastoid) is tender when you touch it. MAKE SURE YOU:   Understand these instructions.  Will watch your condition.  Will get help right away if you are not doing well or get worse.   This  information is not intended to replace advice given to you by your health care provider. Make sure you discuss any questions you have with your health care provider.   Document Released: 05/19/2004 Document Revised: 09/04/2014 Document Reviewed: 03/11/2013 Elsevier Interactive Patient Education 2016 Elsevier Inc.  Upper Respiratory Infection, Adult Most upper respiratory infections (URIs) are a viral infection of the air passages leading to the lungs. A URI affects the nose, throat, and upper air passages. The most common type of URI is nasopharyngitis and is typically referred to as "the common cold." URIs run their course and usually go away on their own. Most of the time, a URI does not require medical attention, but sometimes a bacterial infection in the upper airways can follow a viral infection. This is called a secondary infection. Sinus and middle ear infections are common types of secondary upper respiratory infections. Bacterial pneumonia can also complicate a URI. A URI can worsen asthma and chronic obstructive pulmonary disease (COPD). Sometimes, these complications can require emergency medical care and may be life threatening.  CAUSES Almost all URIs are caused by viruses. A virus is a type of germ and can spread from one person to another.  RISKS FACTORS You may be at risk for a URI if:   You smoke.   You have chronic heart or lung disease.  You have a weakened defense (immune) system.   You are very young or very old.   You have nasal allergies  or asthma.  You work in crowded or poorly ventilated areas.  You work in health care facilities or schools. SIGNS AND SYMPTOMS  Symptoms typically develop 2-3 days after you come in contact with a cold virus. Most viral URIs last 7-10 days. However, viral URIs from the influenza virus (flu virus) can last 14-18 days and are typically more severe. Symptoms may include:   Runny or stuffy (congested) nose.   Sneezing.    Cough.   Sore throat.   Headache.   Fatigue.   Fever.   Loss of appetite.   Pain in your forehead, behind your eyes, and over your cheekbones (sinus pain).  Muscle aches.  DIAGNOSIS  Your health care provider may diagnose a URI by:  Physical exam.  Tests to check that your symptoms are not due to another condition such as:  Strep throat.  Sinusitis.  Pneumonia.  Asthma. TREATMENT  A URI goes away on its own with time. It cannot be cured with medicines, but medicines may be prescribed or recommended to relieve symptoms. Medicines may help:  Reduce your fever.  Reduce your cough.  Relieve nasal congestion. HOME CARE INSTRUCTIONS   Take medicines only as directed by your health care provider.   Gargle warm saltwater or take cough drops to comfort your throat as directed by your health care provider.  Use a warm mist humidifier or inhale steam from a shower to increase air moisture. This may make it easier to breathe.  Drink enough fluid to keep your urine clear or pale yellow.   Eat soups and other clear broths and maintain good nutrition.   Rest as needed.   Return to work when your temperature has returned to normal or as your health care provider advises. You may need to stay home longer to avoid infecting others. You can also use a face mask and careful hand washing to prevent spread of the virus.  Increase the usage of your inhaler if you have asthma.   Do not use any tobacco products, including cigarettes, chewing tobacco, or electronic cigarettes. If you need help quitting, ask your health care provider. PREVENTION  The best way to protect yourself from getting a cold is to practice good hygiene.   Avoid oral or hand contact with people with cold symptoms.   Wash your hands often if contact occurs.  There is no clear evidence that vitamin C, vitamin E, echinacea, or exercise reduces the chance of developing a cold. However, it is  always recommended to get plenty of rest, exercise, and practice good nutrition.  SEEK MEDICAL CARE IF:   You are getting worse rather than better.   Your symptoms are not controlled by medicine.   You have chills.  You have worsening shortness of breath.  You have brown or red mucus.  You have yellow or brown nasal discharge.  You have pain in your face, especially when you bend forward.  You have a fever.  You have swollen neck glands.  You have pain while swallowing.  You have white areas in the back of your throat. SEEK IMMEDIATE MEDICAL CARE IF:   You have severe or persistent:  Headache.  Ear pain.  Sinus pain.  Chest pain.  You have chronic lung disease and any of the following:  Wheezing.  Prolonged cough.  Coughing up blood.  A change in your usual mucus.  You have a stiff neck.  You have changes in your:  Vision.  Hearing.  Thinking.  Mood. MAKE SURE YOU:   Understand these instructions.  Will watch your condition.  Will get help right away if you are not doing well or get worse.   This information is not intended to replace advice given to you by your health care provider. Make sure you discuss any questions you have with your health care provider.   Document Released: 02/07/2001 Document Revised: 12/29/2014 Document Reviewed: 11/19/2013 Elsevier Interactive Patient Education [employer]2016 Elsevier Inc.

## 2015-12-13 ENCOUNTER — Encounter: Payer: Self-pay | Admitting: *Deleted

## 2015-12-15 ENCOUNTER — Other Ambulatory Visit: Payer: Self-pay | Admitting: Obstetrics and Gynecology

## 2015-12-15 ENCOUNTER — Ambulatory Visit (INDEPENDENT_AMBULATORY_CARE_PROVIDER_SITE_OTHER): Payer: BLUE CROSS/BLUE SHIELD | Admitting: Obstetrics and Gynecology

## 2015-12-15 ENCOUNTER — Encounter: Payer: Self-pay | Admitting: Obstetrics and Gynecology

## 2015-12-15 VITALS — BP 124/92 | HR 86 | Ht 66.0 in | Wt 264.2 lb

## 2015-12-15 DIAGNOSIS — Z01419 Encounter for gynecological examination (general) (routine) without abnormal findings: Secondary | ICD-10-CM

## 2015-12-15 DIAGNOSIS — E669 Obesity, unspecified: Secondary | ICD-10-CM

## 2015-12-15 MED ORDER — METFORMIN HCL 850 MG PO TABS: 850.0000 mg | ORAL_TABLET | Freq: Two times a day (BID) | ORAL | Status: DC

## 2015-12-15 NOTE — Progress Notes (Signed)
  Subjective:     Colleen Ryan is a 30 y.o. female and is here for a comprehensive physical exam. The patient reports no problems.  Social History   Social History  . Marital Status: Single    Spouse Name: N/A  . Number of Children: N/A  . Years of Education: N/A   Occupational History  . Not on file.   Social History Main Topics  . Smoking status: Current Every Day Smoker  . Smokeless tobacco: Never Used  . Alcohol Use: No  . Drug Use: No  . Sexual Activity: Yes   Other Topics Concern  . Not on file   Social History Narrative   Health Maintenance  Topic Date Due  . HIV Screening  03/22/2003  . TETANUS/TDAP  03/22/2007  . PAP SMEAR  12/04/2015  . INFLUENZA VACCINE  03/28/2016    The following portions of the patient's history were reviewed and updated as appropriate: allergies, current medications, past family history, past medical history, past social history, past surgical history and problem list.  Review of Systems A comprehensive review of systems was negative.   Objective:    General appearance: alert, cooperative, appears stated age and moderately obese Neck: no adenopathy, no carotid bruit, no JVD, supple, symmetrical, trachea midline and thyroid not enlarged, symmetric, no tenderness/mass/nodules Lungs: clear to auscultation bilaterally Breasts: normal appearance, no masses or tenderness Heart: regular rate and rhythm, S1, S2 normal, no murmur, click, rub or gallop Abdomen: soft, non-tender; bowel sounds normal; no masses,  no organomegaly Pelvic: cervix normal in appearance, external genitalia normal, no adnexal masses or tenderness, no cervical motion tenderness, rectovaginal septum normal, uterus normal size, shape, and consistency and vagina normal without discharge Extremities: extremities normal, atraumatic, no cyanosis or edema    Assessment:    Healthy female exam. PCOS; Obesity; elevated prolactin serum      Plan:  Labs repeated, increased  glucophage to 850mg  bid. Already referred to endocrinology   See After Visit Summary for Counseling Recommendations

## 2015-12-15 NOTE — Patient Instructions (Signed)
Place annual gynecologic exam patient instructions here.

## 2015-12-16 LAB — COMPREHENSIVE METABOLIC PANEL
A/G RATIO: 1.3 (ref 1.2–2.2)
ALT: 26 IU/L (ref 0–32)
AST: 21 IU/L (ref 0–40)
Albumin: 4.2 g/dL (ref 3.5–5.5)
Alkaline Phosphatase: 103 IU/L (ref 39–117)
BUN/Creatinine Ratio: 16 (ref 9–23)
BUN: 13 mg/dL (ref 6–20)
Bilirubin Total: 0.4 mg/dL (ref 0.0–1.2)
CO2: 23 mmol/L (ref 18–29)
Calcium: 9.3 mg/dL (ref 8.7–10.2)
Chloride: 101 mmol/L (ref 96–106)
Creatinine, Ser: 0.83 mg/dL (ref 0.57–1.00)
GFR calc non Af Amer: 97 mL/min/{1.73_m2} (ref >59)
GFR, EST AFRICAN AMERICAN: 112 mL/min/{1.73_m2} (ref >59)
GLOBULIN, TOTAL: 3.3 g/dL (ref 1.5–4.5)
Glucose: 100 mg/dL — ABNORMAL HIGH (ref 65–99)
POTASSIUM: 4.1 mmol/L (ref 3.5–5.2)
SODIUM: 142 mmol/L (ref 134–144)
TOTAL PROTEIN: 7.5 g/dL (ref 6.0–8.5)

## 2015-12-16 LAB — CYTOLOGY - PAP

## 2015-12-16 LAB — HEMOGLOBIN A1C
Est. average glucose Bld gHb Est-mCnc: 117 mg/dL
Hgb A1c MFr Bld: 5.7 % — ABNORMAL HIGH (ref 4.8–5.6)

## 2015-12-16 LAB — THYROID PANEL WITH TSH
Free Thyroxine Index: 2.2 (ref 1.2–4.9)
T3 UPTAKE RATIO: 25 % (ref 24–39)
T4 TOTAL: 8.8 ug/dL (ref 4.5–12.0)
TSH: 1.38 u[IU]/mL (ref 0.450–4.500)

## 2015-12-16 LAB — BETA HCG QUANT (REF LAB): hCG Quant: <1 m[IU]/mL

## 2015-12-16 LAB — LIPID PANEL
CHOLESTEROL TOTAL: 177 mg/dL (ref 100–199)
Chol/HDL Ratio: 3.4 ratio units (ref 0.0–4.4)
HDL: 52 mg/dL (ref >39)
LDL Calculated: 104 mg/dL — ABNORMAL HIGH (ref 0–99)
TRIGLYCERIDES: 104 mg/dL (ref 0–149)
VLDL Cholesterol Cal: 21 mg/dL (ref 5–40)

## 2015-12-16 LAB — PROLACTIN: Prolactin: 9.9 ng/mL (ref 4.8–23.3)

## 2015-12-17 ENCOUNTER — Encounter: Payer: Self-pay | Admitting: Obstetrics and Gynecology

## 2016-01-12 ENCOUNTER — Ambulatory Visit (INDEPENDENT_AMBULATORY_CARE_PROVIDER_SITE_OTHER): Payer: BLUE CROSS/BLUE SHIELD | Admitting: Obstetrics and Gynecology

## 2016-01-12 VITALS — BP 107/88 | HR 84 | Wt 267.1 lb

## 2016-01-12 DIAGNOSIS — E669 Obesity, unspecified: Secondary | ICD-10-CM | POA: Diagnosis not present

## 2016-01-12 MED ORDER — CYANOCOBALAMIN 1000 MCG/ML IJ SOLN
1000.0000 ug | Freq: Once | INTRAMUSCULAR | Status: AC
  Administered 2016-01-12: 1000 ug via INTRAMUSCULAR

## 2016-01-12 NOTE — Progress Notes (Signed)
Patient ID: Colleen Ryan, female   DOB: [birth year], 30 y.o.   MRN: [id number] Pt given Rx for phentermine and B-12 medication. Has not started medication yet.  Wt 267lb today. Will f/u 4 wk for weight, B/P, and B-12 injection.

## 2016-01-27 ENCOUNTER — Encounter: Payer: Self-pay | Admitting: Obstetrics and Gynecology

## 2016-01-28 ENCOUNTER — Ambulatory Visit: Payer: BLUE CROSS/BLUE SHIELD | Admitting: Internal Medicine

## 2016-01-28 DIAGNOSIS — Z0289 Encounter for other administrative examinations: Secondary | ICD-10-CM

## 2016-02-09 ENCOUNTER — Ambulatory Visit: Payer: BLUE CROSS/BLUE SHIELD

## 2016-02-11 ENCOUNTER — Ambulatory Visit (INDEPENDENT_AMBULATORY_CARE_PROVIDER_SITE_OTHER): Payer: BLUE CROSS/BLUE SHIELD | Admitting: Obstetrics and Gynecology

## 2016-02-11 VITALS — BP 114/73 | HR 80 | Ht 66.0 in | Wt 262.3 lb

## 2016-02-11 DIAGNOSIS — E669 Obesity, unspecified: Secondary | ICD-10-CM | POA: Diagnosis not present

## 2016-02-11 DIAGNOSIS — R5382 Chronic fatigue, unspecified: Secondary | ICD-10-CM | POA: Diagnosis not present

## 2016-02-11 MED ORDER — CYANOCOBALAMIN 1000 MCG/ML IJ SOLN
1000.0000 ug | Freq: Once | INTRAMUSCULAR | Status: AC
  Administered 2016-02-11: 1000 ug via INTRAMUSCULAR

## 2016-02-11 NOTE — Progress Notes (Signed)
Filed Weights   02/11/16 1101  Weight: 262 lb 4.8 oz (118.978 kg)    Pt presents today for weight management. Pt denies adverse side effects with medication. Pt today with weight loss of 5lbs. Pt states she has difficulty exercising due to work schedule, but will try. Pt given 1mL of B12 from office stock, which she tolerated well.

## 2016-03-23 IMAGING — US US PELVIS COMPLETE
1 series · 14 of 25 positions shown · non-contrast
Comparison: None

CLINICAL DATA: Left pelvic pain for 3 days

EXAM:
TRANSABDOMINAL AND TRANSVAGINAL ULTRASOUND OF PELVIS
TECHNIQUE: Both transabdominal and transvaginal ultrasound examinations of the
pelvis were performed. Transabdominal technique was performed for
global imaging of the pelvis including uterus, ovaries, adnexal
regions, and pelvic cul-de-sac. It was necessary to proceed with
endovaginal exam following the transabdominal exam to visualize the
ovaries and endometrium.

[Series 1: us pelvis complete · 0.22mm/px · 14 of 47 slices shown]
[im 1/47]
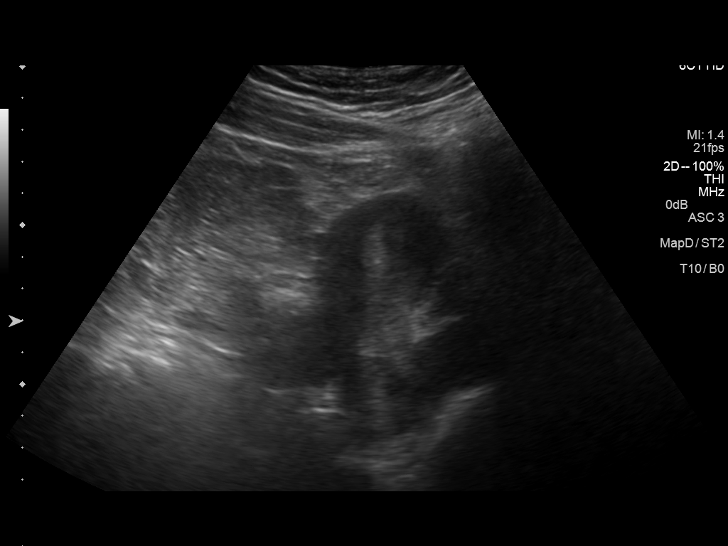
[im 4/47]
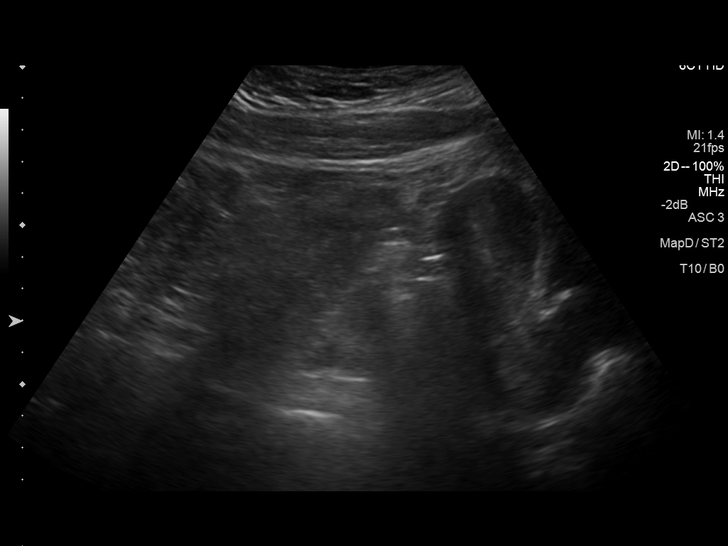
[im 8/47]
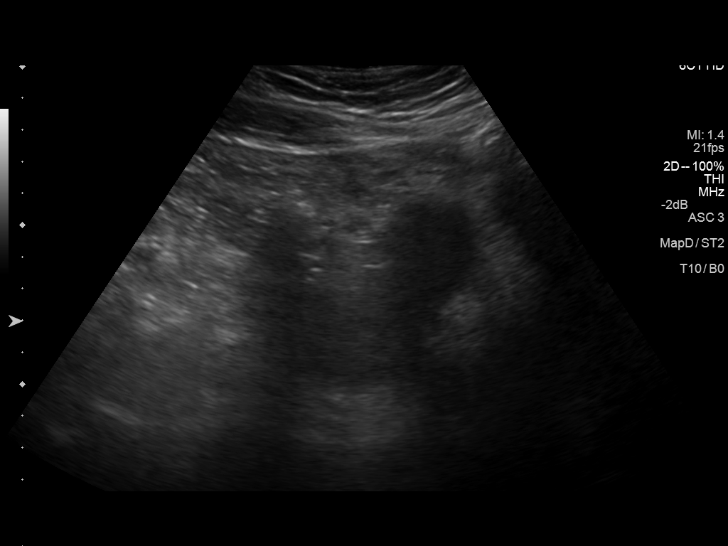
[im 12/47]
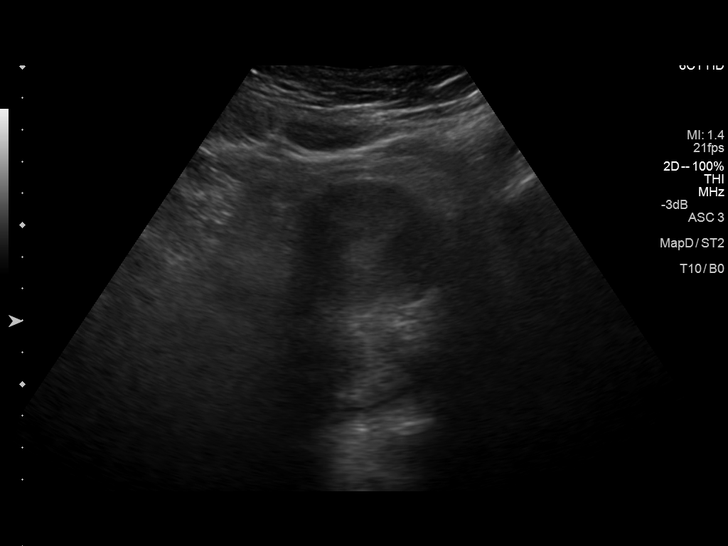
[im 16/47]
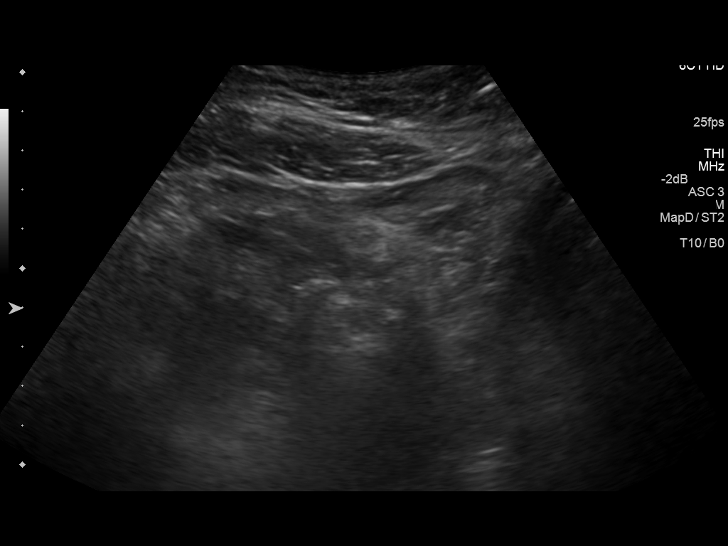
[im 18/47]
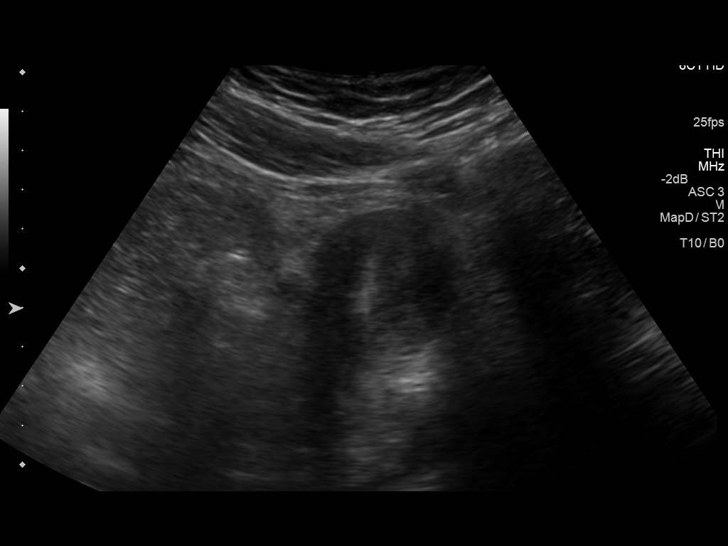
[im 22/47]
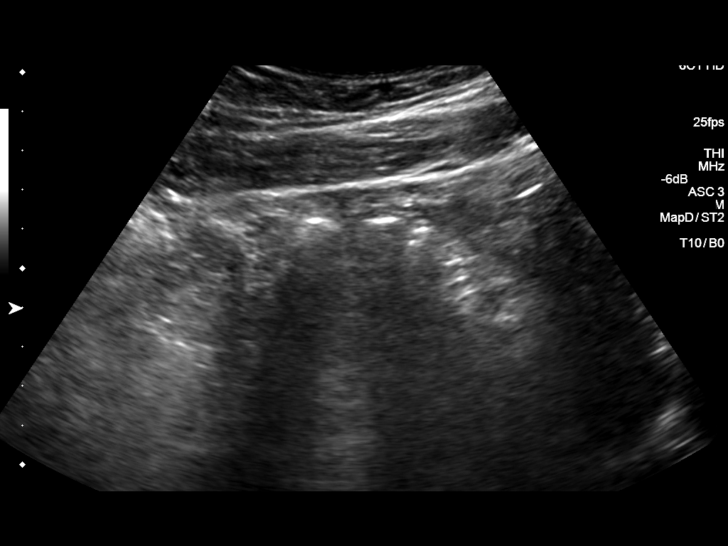
[im 25/47]
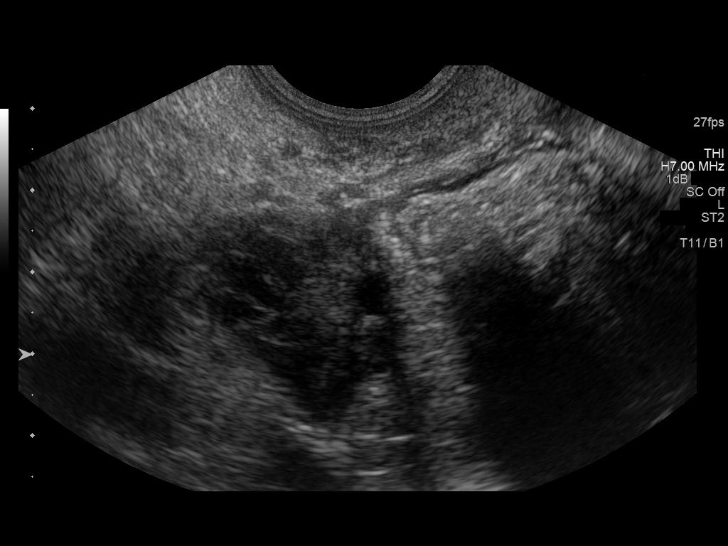
[im 29/47]
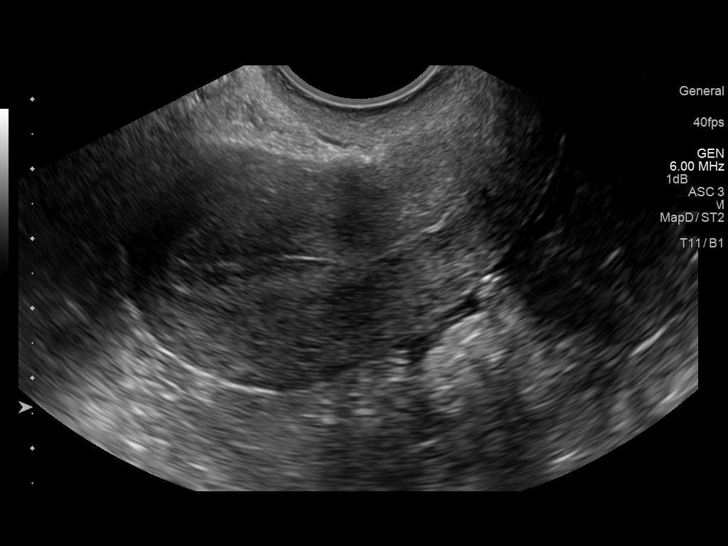
[im 31/47]
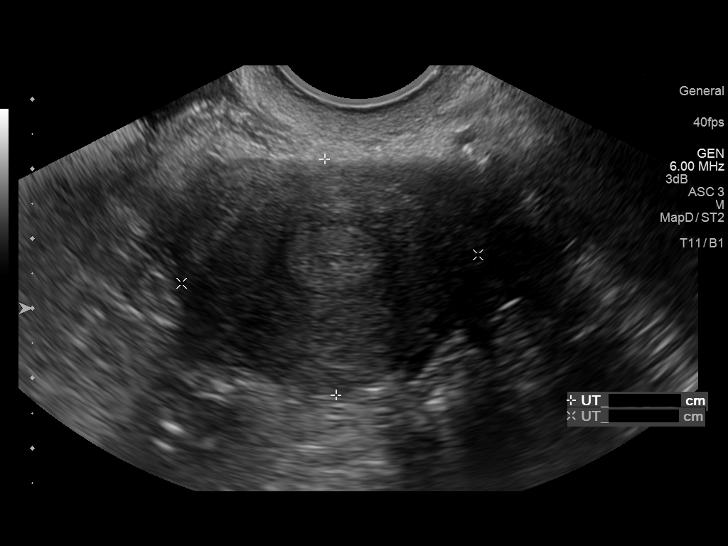
[im 35/47]
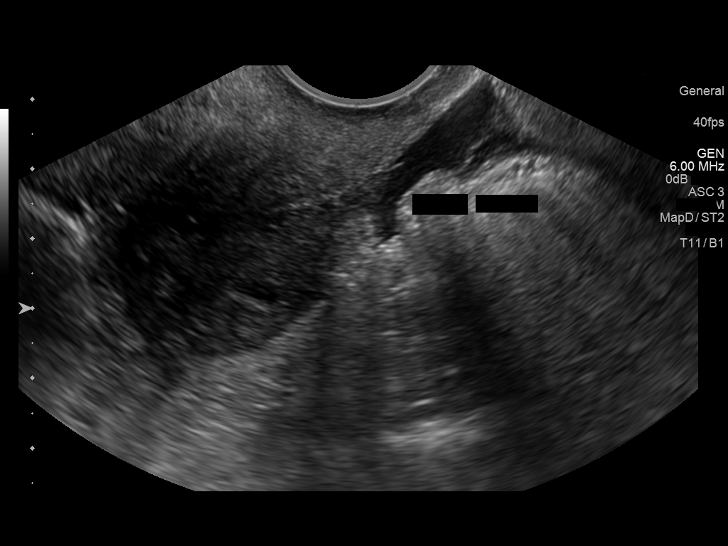
[im 39/47]
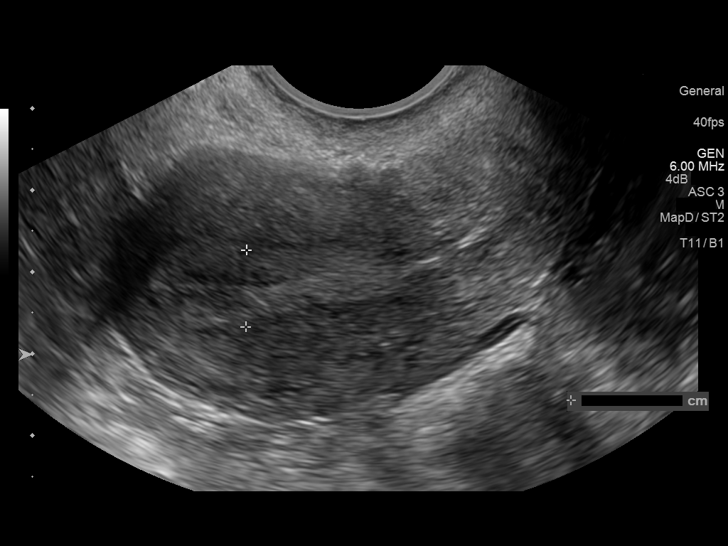
[im 43/47]
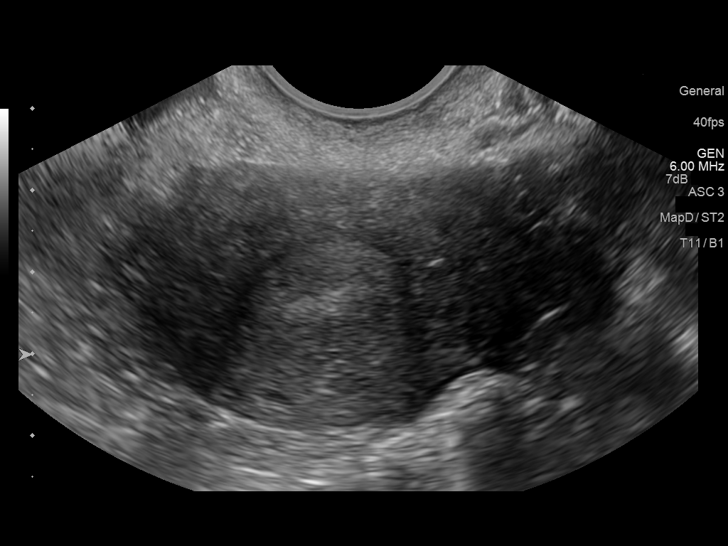
[im 47/47]
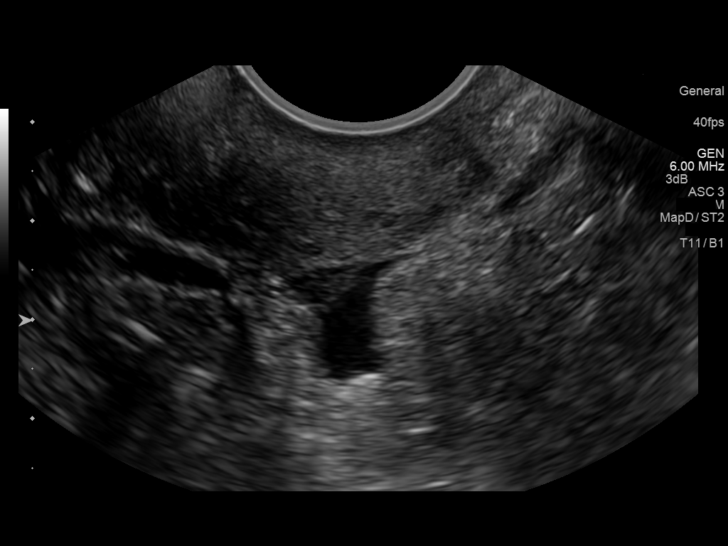

[14 of 25 positions shown; findings below may reference images not displayed]

FINDINGS: Uterus

Measurements: 6 x 4 x 4 cm. No fibroids or other mass visualized.

Endometrium

Thickness: 10 mm.  No focal abnormality visualized.

Right ovary

Measurements: 28 x 27 x 23 mm. Normal appearance/no adnexal mass.

Left ovary

Measurements: 40 x 30 x 30 mm. There is likely a corpus luteum
centrally. Small simple appearing free fluid around the left ovary.

Other findings

No free fluid.
IMPRESSION: Small free fluid around the left ovary, likely recent follicle
rupture.

## 2016-04-17 ENCOUNTER — Encounter: Payer: Self-pay | Admitting: Obstetrics and Gynecology

## 2016-04-21 ENCOUNTER — Ambulatory Visit: Payer: BLUE CROSS/BLUE SHIELD

## 2016-04-21 ENCOUNTER — Ambulatory Visit (INDEPENDENT_AMBULATORY_CARE_PROVIDER_SITE_OTHER): Payer: BLUE CROSS/BLUE SHIELD | Admitting: Obstetrics and Gynecology

## 2016-04-21 VITALS — BP 137/100 | HR 73 | Ht 66.0 in | Wt 257.0 lb

## 2016-04-21 DIAGNOSIS — R5383 Other fatigue: Secondary | ICD-10-CM | POA: Diagnosis not present

## 2016-04-21 DIAGNOSIS — E669 Obesity, unspecified: Secondary | ICD-10-CM

## 2016-04-21 DIAGNOSIS — N946 Dysmenorrhea, unspecified: Secondary | ICD-10-CM

## 2016-04-21 MED ORDER — IBUPROFEN 800 MG PO TABS
800.0000 mg | ORAL_TABLET | Freq: Three times a day (TID) | ORAL | 1 refills | Status: DC | PRN
Start: 2016-04-21 — End: 2016-09-08

## 2016-04-21 MED ORDER — CYANOCOBALAMIN 1000 MCG/ML IJ SOLN
1000.0000 ug | Freq: Once | INTRAMUSCULAR | Status: AC
  Administered 2016-04-21: 1000 ug via INTRAMUSCULAR

## 2016-04-21 NOTE — Progress Notes (Signed)
Filed Weights   04/21/16 1158  Weight: 257 lb (116.6 kg)   Pt presents today for WT management, pt states that she has not taken Adipex in one month. Will resume today. Pt also notes that she does not feel any effects from the B12, still no energy. Advised pt to discuss with provider at next visit. Pt today also in severe pain from menstrual cramps, scheduled pt appt to address this. Given rx for 800mg  Motrin. PT today with weight loss of 5lbs. Pt also with very elevated BP of 137/100. Advised to go to ED.

## 2016-05-12 ENCOUNTER — Ambulatory Visit: Payer: BLUE CROSS/BLUE SHIELD | Admitting: Obstetrics and Gynecology

## 2016-05-16 ENCOUNTER — Ambulatory Visit: Payer: BLUE CROSS/BLUE SHIELD | Admitting: Obstetrics and Gynecology

## 2016-05-26 ENCOUNTER — Other Ambulatory Visit: Payer: Self-pay | Admitting: Obstetrics and Gynecology

## 2016-05-26 ENCOUNTER — Telehealth: Payer: Self-pay | Admitting: Obstetrics and Gynecology

## 2016-05-26 ENCOUNTER — Ambulatory Visit: Payer: BLUE CROSS/BLUE SHIELD | Admitting: Obstetrics and Gynecology

## 2016-05-26 MED ORDER — TERCONAZOLE 0.4 % VA CREA: 1.0000 | TOPICAL_CREAM | Freq: Every day | VAGINAL | 0 refills | Status: DC

## 2016-05-26 NOTE — Telephone Encounter (Signed)
Please let her know [facility]

## 2016-05-26 NOTE — Telephone Encounter (Signed)
PT CAME IN FOR HER APPT BUT WAS LATE AND SHE KNOWS SHE HAS A YEAST INFECTION THAT WAS GOING TO TO BE ANOTHER REASON TO SEE MNS FOR TODAY SOS HEW AS WONDERING IF SOMETHING COULD BE CALLED IN FOR HER, BUT SHE DOES WANT TO CHANGE PHARMACY TO [facility]

## 2016-05-26 NOTE — Telephone Encounter (Signed)
Notified pt she voiced understanding 

## 2016-05-26 NOTE — Telephone Encounter (Signed)
Pt came in about 11:20, I made her reschedule, pls advise

## 2016-06-08 ENCOUNTER — Ambulatory Visit: Payer: BLUE CROSS/BLUE SHIELD | Admitting: Obstetrics and Gynecology

## 2016-09-08 ENCOUNTER — Encounter: Payer: Self-pay | Admitting: Obstetrics and Gynecology

## 2016-09-08 ENCOUNTER — Ambulatory Visit (INDEPENDENT_AMBULATORY_CARE_PROVIDER_SITE_OTHER): Payer: BLUE CROSS/BLUE SHIELD | Admitting: Obstetrics and Gynecology

## 2016-09-08 VITALS — BP 121/83 | HR 84 | Ht 67.0 in | Wt 259.0 lb

## 2016-09-08 DIAGNOSIS — Z6841 Body Mass Index (BMI) 40.0 and over, adult: Secondary | ICD-10-CM

## 2016-09-08 DIAGNOSIS — E282 Polycystic ovarian syndrome: Secondary | ICD-10-CM

## 2016-09-08 DIAGNOSIS — E669 Obesity, unspecified: Secondary | ICD-10-CM

## 2016-09-08 DIAGNOSIS — N946 Dysmenorrhea, unspecified: Secondary | ICD-10-CM

## 2016-09-08 DIAGNOSIS — L680 Hirsutism: Secondary | ICD-10-CM | POA: Diagnosis not present

## 2016-09-08 DIAGNOSIS — Z79899 Other long term (current) drug therapy: Secondary | ICD-10-CM

## 2016-09-08 DIAGNOSIS — N920 Excessive and frequent menstruation with regular cycle: Secondary | ICD-10-CM

## 2016-09-08 MED ORDER — METFORMIN HCL 850 MG PO TABS: 850.0000 mg | ORAL_TABLET | Freq: Two times a day (BID) | ORAL | 6 refills | Status: DC

## 2016-09-08 MED ORDER — DROSPIREN-ETH ESTRAD-LEVOMEFOL 3-0.02-0.451 MG PO TABS: 1.0000 | ORAL_TABLET | Freq: Every day | ORAL | 11 refills | Status: DC

## 2016-09-08 MED ORDER — PHENTERMINE HCL 37.5 MG PO TABS: 37.5000 mg | ORAL_TABLET | Freq: Every day | ORAL | 2 refills | Status: DC

## 2016-09-08 MED ORDER — IBUPROFEN 800 MG PO TABS: 800.0000 mg | ORAL_TABLET | Freq: Three times a day (TID) | ORAL | 1 refills | Status: DC | PRN

## 2016-09-08 MED ORDER — CYANOCOBALAMIN 1000 MCG/ML IJ SOLN: 1000.0000 ug | INTRAMUSCULAR | 2 refills | Status: DC

## 2016-09-08 NOTE — Progress Notes (Signed)
SUBJECTIVE:  31 y.o. here for follow-up weight loss visit and medication management. previously seen 4 months ago. Stopped medication due to family member death and helping her father. Desires restart. Also reports menses are monthly now, but very heavy and painful, changing pad and super tampon hourly. Ibuprofen helps. Is taking metformin sporadiacally. Hirsutism on face is much worse. Open to restarting hormones.  OBJECTIVE:  BP 121/83   Pulse 84   Ht 5\' 7"  (1.702 m)   Wt 259 lb (117.5 kg)   LMP 08/13/2016   BMI 40.57 kg/m   Body mass index is 40.57 kg/m. Patient appears well. Chin and next hirsutism Pelvic exam: normal external genitalia, vulva, vagina, cervix, uterus and adnexa.   ASSESSMENT:  Obesity PCOS Dysmenorrhea and menorrhagia Hirsutism  PLAN:  To continue with current medications and restart weight loss meds. B12 104100mcg/ml injection given Encouraged regular metformin use as prescribed Beyaz rx sent in -to start of 4th day of next menses.   RTC in 4 weeks as planned  Colleen Ryan, CNM

## 2016-10-03 ENCOUNTER — Encounter: Payer: BLUE CROSS/BLUE SHIELD | Admitting: Obstetrics and Gynecology

## 2016-10-06 ENCOUNTER — Telehealth: Payer: Self-pay

## 2016-10-06 ENCOUNTER — Ambulatory Visit (INDEPENDENT_AMBULATORY_CARE_PROVIDER_SITE_OTHER): Payer: BLUE CROSS/BLUE SHIELD | Admitting: Obstetrics and Gynecology

## 2016-10-06 VITALS — BP 133/81 | HR 83 | Ht 67.0 in | Wt 250.0 lb

## 2016-10-06 DIAGNOSIS — E6609 Other obesity due to excess calories: Secondary | ICD-10-CM

## 2016-10-06 DIAGNOSIS — IMO0001 Reserved for inherently not codable concepts without codable children: Secondary | ICD-10-CM

## 2016-10-06 DIAGNOSIS — R5383 Other fatigue: Secondary | ICD-10-CM | POA: Diagnosis not present

## 2016-10-06 DIAGNOSIS — Z6841 Body Mass Index (BMI) 40.0 and over, adult: Secondary | ICD-10-CM

## 2016-10-06 MED ORDER — CYANOCOBALAMIN 1000 MCG/ML IJ SOLN
1000.0000 ug | Freq: Once | INTRAMUSCULAR | Status: AC
  Administered 2016-10-06: 1000 ug via INTRAMUSCULAR

## 2016-10-06 NOTE — Telephone Encounter (Signed)
Pt states that she was recently started on OCP, pt states that she took the medication for one week but had to discontinue due to migraines. Pt states that she has tried several (6) different OCPs. Please advise

## 2016-10-06 NOTE — Patient Instructions (Signed)

## 2016-10-06 NOTE — Telephone Encounter (Signed)
pls advise

## 2016-10-06 NOTE — Progress Notes (Signed)
Pt presents for weight, B/P, B-12 injection. No side effects of medication-Phentermine, or B-12.  Weight loss of ___9__ lbs. Encouraged eating healthy and exercise. Pt also c/o issues with current OCP will send message to [facility]. Given in house supply of B12 as pt forgot hers.

## 2016-10-10 ENCOUNTER — Other Ambulatory Visit: Payer: Self-pay | Admitting: Obstetrics and Gynecology

## 2016-10-10 DIAGNOSIS — E282 Polycystic ovarian syndrome: Secondary | ICD-10-CM

## 2016-10-10 NOTE — Telephone Encounter (Signed)
Please let her know to stop medication. Also I put in a referral for endocrinology please see if she has a preference and if not send to [facility]

## 2016-11-14 ENCOUNTER — Encounter: Payer: Self-pay | Admitting: Emergency Medicine

## 2016-11-14 ENCOUNTER — Emergency Department
Admission: EM | Admit: 2016-11-14 | Discharge: 2016-11-14 | Disposition: A | Discharge status: Home / Self Care | Payer: BLUE CROSS/BLUE SHIELD | Attending: Emergency Medicine | Admitting: Emergency Medicine

## 2016-11-14 DIAGNOSIS — R05 Cough: Secondary | ICD-10-CM | POA: Diagnosis present

## 2016-11-14 DIAGNOSIS — F17210 Nicotine dependence, cigarettes, uncomplicated: Secondary | ICD-10-CM | POA: Diagnosis not present

## 2016-11-14 DIAGNOSIS — K047 Periapical abscess without sinus: Secondary | ICD-10-CM | POA: Diagnosis not present

## 2016-11-14 DIAGNOSIS — J111 Influenza due to unidentified influenza virus with other respiratory manifestations: Secondary | ICD-10-CM | POA: Insufficient documentation

## 2016-11-14 MED ORDER — OSELTAMIVIR PHOSPHATE 75 MG PO CAPS: 75.0000 mg | ORAL_CAPSULE | Freq: Two times a day (BID) | ORAL | 0 refills | Status: AC

## 2016-11-14 MED ORDER — AMOXICILLIN 500 MG PO TABS: 500.0000 mg | ORAL_TABLET | Freq: Three times a day (TID) | ORAL | 0 refills | Status: AC

## 2016-11-14 NOTE — ED Triage Notes (Signed)
Pt presents to ED with fever, body aches, and cough since Sunday. Pt states she was starting to feel a little better today but then her fever came back again this evening. Pt has no increased work of breathing or acute distress noted at this time.

## 2016-11-15 NOTE — ED Provider Notes (Signed)
[facility] Emergency Department Provider Note  ____________________________________________  Time seen: Approximately 3:24 PM  I have reviewed the triage vital signs and the nursing notes.   HISTORY  Chief Complaint Fever; Cough; and Generalized Body Aches    HPI Colleen Ryan is a 31 y.o. female presents to the emergency department with headache, congestion, rhinorrhea, nonproductive cough and fatigue for the past 3 days. Patient also reports a small dental abscess from superior 15. Patient states that she has had fever. However, she has not evaluated her temperature. Patient is tolerating fluids by mouth. She has had decreased appetite. No recent travel. Patient denies chest pain, chest tightness, shortness of breath, abdominal pain, nausea and vomiting. Patient has been given Tylenol but no other alleviating measures.    Past Medical History:  Diagnosis Date  . Genital herpes   . Insomnia   . Overweight   . Polycystic ovarian disease     There are no active problems to display for this patient.   History reviewed. No pertinent surgical history.  Prior to Admission medications   Medication Sig Start Date End Date Taking? Authorizing Provider  amoxicillin (AMOXIL) 500 MG tablet Take 1 tablet (500 mg total) by mouth 3 (three) times daily. 11/14/16 11/24/16  Orvil Feil, PA-C  cyanocobalamin (,VITAMIN B-12,) 1000 MCG/ML injection Inject 1 mL (1,000 mcg total) into the skin every 30 (thirty) days. 09/08/16   Melody N Shambley, CNM  Drospirenone-Ethinyl Estradiol-Levomefol (BEYAZ) 3-0.02-0.451 MG tablet Take 1 tablet by mouth daily. Patient not taking: Reported on 10/06/2016 09/08/16   Melody N Shambley, CNM  ibuprofen (ADVIL,MOTRIN) 800 MG tablet Take 1 tablet (800 mg total) by mouth every 8 (eight) hours as needed. 09/08/16   Melody N Shambley, CNM  metFORMIN (GLUCOPHAGE) 850 MG tablet Take 1 tablet (850 mg total) by mouth 2 (two) times daily with a  meal. Patient taking differently: Take 850 mg by mouth 2 (two) times daily with a meal.  09/08/16   Melody N Shambley, CNM  oseltamivir (TAMIFLU) 75 MG capsule Take 1 capsule (75 mg total) by mouth 2 (two) times daily. 11/14/16 11/19/16  Orvil Feil, PA-C  phentermine (ADIPEX-P) 37.5 MG tablet Take 1 tablet (37.5 mg total) by mouth daily before breakfast. 09/08/16   Melody N Shambley, CNM    Allergies Bactrim [sulfamethoxazole-trimethoprim]  No family history on file.  Social History Social History  Substance Use Topics  . Smoking status: Current Every Day Smoker    Packs/day: 0.50    Types: Cigarettes  . Smokeless tobacco: Never Used  . Alcohol use No    Review of Systems  Constitutional: Patient has had fever.  Eyes: No visual changes. No discharge ENT: Patient has had congestion. Patient has a dental abscess. Cardiovascular: no chest pain. Respiratory: Patient has had non-productive cough.  No SOB. Gastrointestinal: Patient has had nausea.  Genitourinary: Negative for dysuria. No hematuria Musculoskeletal: Patient has had myalgias. Skin: Negative for rash, abrasions, lacerations, ecchymosis. Neurological: Negative for headaches, focal weakness or numbness.  ____________________________________________   PHYSICAL EXAM:  VITAL SIGNS: ED Triage Vitals [11/14/16 2120]  Enc Vitals Group     BP 120/86     Pulse Rate 96     Resp 20     Temp 98.9 F (37.2 C)     Temp Source Oral     SpO2 100 %     Weight 249 lb (112.9 kg)     Height 5\' 6"  (1.676 m)  Head Circumference      Peak Flow      Pain Score 6     Pain Loc      Pain Edu?      Excl. in GC?     Constitutional: Alert and oriented. Patient is lying supine in bed.  Eyes: Conjunctivae are normal. PERRL. EOMI. Head: Atraumatic. ENT:      Ears: Tympanic membranes are injected bilaterally without evidence of effusion or purulent exudate. Bony landmarks are visualized bilaterally. No pain with palpation at the  tragus.      Nose: Nasal turbinates are edematous and erythematous. Copious rhinorrhea visualized.      Mouth/Throat: Mucous membranes are moist. Airway is patent. Posterior pharynx is mildly erythematous. No tonsillar hypertrophy or purulent exudate. Uvula is midline. Patient has gingival hypertrophy and likely small dental abscess surrounding Superior 15. Neck: Full range of motion. No pain is elicited with flexion at the neck. Hematological/Lymphatic/Immunilogical: No cervical lymphadenopathy. Cardiovascular: Normal rate, regular rhythm. Normal S1 and S2.  Good peripheral circulation. Respiratory: Normal respiratory effort without tachypnea or retractions. Lungs CTAB. Good air entry to the bases with no decreased or absent breath sounds. Gastrointestinal: Bowel sounds 4 quadrants. Soft and nontender to palpation. No guarding or rigidity. No palpable masses. No distention. No CVA tenderness.  Skin:  Skin is warm, dry and intact. No rash noted. Psychiatric: Mood and affect are normal. Speech and behavior are normal. Patient exhibits appropriate insight and judgement.  ____________________________________________   LABS (all labs ordered are listed, but only abnormal results are displayed)  Labs Reviewed - No data to display ____________________________________________  EKG   ____________________________________________  RADIOLOGY   No results found.  ____________________________________________    PROCEDURES  Procedure(s) performed:    Procedures    Medications - No data to display   ____________________________________________   INITIAL IMPRESSION / ASSESSMENT AND PLAN / ED COURSE  Pertinent labs & imaging results that were available during my care of the patient were reviewed by me and considered in my medical decision making (see chart for details).  Review of the [place] CSRS was performed in accordance of the [facility] prior to dispensing any controlled drugs.      Assessment and Plan:  Influenza: Dental Abscess  Patient presents to the emergency department with headache, congestion, rhinorrhea, nonproductive cough and fatigue for the past 3 days. Symptoms are consistent with influenza. Tamiflu was prescribed at discharge. Patient also has a small dental abscess from Superior 15. She was discharged with amoxicillin and advised to seek care with a dentist immediately. Rest and hydration were encouraged. Patient was advised to follow-up with her primary care provider in one week. Physical exam and vital signs are reassuring at this time. All patient questions were answered. ___________________________________________  FINAL CLINICAL IMPRESSION(S) / ED DIAGNOSES  Final diagnoses:  Influenza  Dental abscess      NEW MEDICATIONS STARTED DURING THIS VISIT:  Discharge Medication List as of 11/14/2016 10:14 PM    START taking these medications   Details  amoxicillin (AMOXIL) 500 MG tablet Take 1 tablet (500 mg total) by mouth 3 (three) times daily., Starting Tue 11/14/2016, Until Fri 11/24/2016, Print    oseltamivir (TAMIFLU) 75 MG capsule Take 1 capsule (75 mg total) by mouth 2 (two) times daily., Starting Tue 11/14/2016, Until Sun 11/19/2016, Print            This chart was dictated using voice recognition software/Dragon. Despite best efforts to proofread, errors can occur which can  change the meaning. Any change was purely unintentional.    Orvil FeilJaclyn M Rayette Mogg, PA-C 11/15/16 1533    Myrna Blazeravid Matthew Schaevitz, MD 11/17/16 [phone]

## 2016-12-01 ENCOUNTER — Other Ambulatory Visit: Payer: Self-pay | Admitting: Obstetrics and Gynecology

## 2016-12-19 ENCOUNTER — Encounter: Payer: BLUE CROSS/BLUE SHIELD | Admitting: Obstetrics and Gynecology

## 2017-01-12 ENCOUNTER — Encounter: Payer: Self-pay | Admitting: Obstetrics and Gynecology

## 2017-01-16 ENCOUNTER — Other Ambulatory Visit: Payer: Self-pay | Admitting: Obstetrics and Gynecology

## 2017-01-16 MED ORDER — CLOMIPHENE CITRATE 50 MG PO TABS: 50.0000 mg | ORAL_TABLET | Freq: Every day | ORAL | 3 refills | Status: DC

## 2017-01-17 ENCOUNTER — Emergency Department
Admission: EM | Admit: 2017-01-17 | Discharge: 2017-01-17 | Disposition: A | Discharge status: Home / Self Care | Payer: BLUE CROSS/BLUE SHIELD | Attending: Emergency Medicine | Admitting: Emergency Medicine

## 2017-01-17 ENCOUNTER — Emergency Department: Payer: BLUE CROSS/BLUE SHIELD

## 2017-01-17 ENCOUNTER — Encounter: Payer: Self-pay | Admitting: Medical Oncology

## 2017-01-17 DIAGNOSIS — M779 Enthesopathy, unspecified: Secondary | ICD-10-CM | POA: Diagnosis not present

## 2017-01-17 DIAGNOSIS — M778 Other enthesopathies, not elsewhere classified: Secondary | ICD-10-CM

## 2017-01-17 DIAGNOSIS — F17210 Nicotine dependence, cigarettes, uncomplicated: Secondary | ICD-10-CM | POA: Diagnosis not present

## 2017-01-17 DIAGNOSIS — M79642 Pain in left hand: Secondary | ICD-10-CM | POA: Diagnosis present

## 2017-01-17 MED ORDER — NABUMETONE 750 MG PO TABS: 750.0000 mg | ORAL_TABLET | Freq: Two times a day (BID) | ORAL | 0 refills | Status: DC

## 2017-01-17 NOTE — ED Triage Notes (Signed)
Pt reports for over a week she has been having left hand pain without known injury.

## 2017-01-17 NOTE — ED Notes (Signed)
Alert, oriented, ambulatory. States cut to L index finger and now L wrist pain. Afraid wrist is infected. 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 No stitches, healed on own. Wrist mainly hurts on thumb side. No known injury to wrist. Pain x 2 weeks.

## 2017-01-17 NOTE — ED Provider Notes (Signed)
[facility] Emergency Department Provider Note ____________________________________________  Time seen: 1534  I have reviewed the triage vital signs and the nursing notes.  HISTORY  Chief Complaint  Hand Pain  HPI Colleen Ryan is a 31 y.o. female left-handed patient with complaints of left hand pain at the thumb. She reports onset about 3 weeks prior, without known injury. She describes hand weakness and stiffness with work activities. She denies any recent injury, trauma, or accident. She denies a history of arthritis, bony fracture, or tendinitis. 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 She denies swelling; skin temperature or color changes; or distal paresthesias.   Past Medical History:  Diagnosis Date  . Genital herpes   . Insomnia   . Overweight   . Polycystic ovarian disease     There are no active problems to display for this patient.   History reviewed. No pertinent surgical history.  Prior to Admission medications   Medication Sig Start Date End Date Taking? Authorizing Provider  clomiPHENE (CLOMID) 50 MG tablet Take 1 tablet (50 mg total) by mouth daily. Take on days 5-9 of your period 01/16/17   Shambley, Melody N, CNM  cyanocobalamin (,VITAMIN B-12,) 1000 MCG/ML injection Inject 1 mL (1,000 mcg total) into the skin every 30 (thirty) days. 09/08/16   Shambley, Melody N, CNM  Drospirenone-Ethinyl Estradiol-Levomefol (BEYAZ) 3-0.02-0.451 MG tablet Take 1 tablet by mouth daily. Patient not taking: Reported on 10/06/2016 09/08/16   Shambley, Melody N, CNM  ibuprofen (ADVIL,MOTRIN) 800 MG tablet TAKE ONE TABLET BY MOUTH EVERY 8 HOURS AS NEEDED 12/01/16   Shambley, Melody N, CNM  metFORMIN (GLUCOPHAGE) 850 MG tablet Take 1 tablet (850 mg total) by mouth 2 (two) times daily with a meal. Patient taking differently: Take 850 mg by mouth 2 (two) times daily with a meal.  09/08/16   Shambley, Melody N, CNM   nabumetone (RELAFEN) 750 MG tablet Take 1 tablet (750 mg total) by mouth 2 (two) times daily. 01/17/17   Jarid Sasso, Charlesetta Ivory, PA-C  phentermine (ADIPEX-P) 37.5 MG tablet Take 1 tablet (37.5 mg total) by mouth daily before breakfast. 09/08/16   Shambley, Melody N, CNM    Allergies Bactrim [sulfamethoxazole-trimethoprim]  No family history on file.  Social History Social History  Substance Use Topics  . Smoking status: Current Every Day Smoker    Packs/day: 0.50    Types: Cigarettes  . Smokeless tobacco: Never Used  . Alcohol use No    Review of Systems  Constitutional: Negative for fever. Cardiovascular: Negative for chest pain. Respiratory: Negative for shortness of breath. Musculoskeletal: Negative for back pain. Left hand pain as above Skin: Negative for rash. Neurological: Negative for headaches, focal weakness or numbness. ____________________________________________  PHYSICAL EXAM:  VITAL SIGNS: ED Triage Vitals  Enc Vitals Group     BP 01/17/17 1522 (!) 149/87     Pulse Rate 01/17/17 1522 78     Resp 01/17/17 1522 18     Temp 01/17/17 1522 97.8 F (36.6 C)     Temp Source 01/17/17 1522 Oral     SpO2 01/17/17 1522 98 %     Weight 01/17/17 1521 245 lb (111.1 kg)     Height 01/17/17 1521 5\' 6"  (1.676 m)     Head Circumference --      Peak Flow --      Pain Score 01/17/17 1521 7     Pain Loc --  Pain Edu? --      Excl. in GC? --     Constitutional: Alert and oriented. Well appearing and in no distress. Head: Normocephalic and atraumatic. Cardiovascular: Normal rate, regular rhythm. Normal distal pulses. Respiratory: Normal respiratory effort. No wheezes/rales/rhonchi. Musculoskeletal: Left hand without any obvious deformity, dislocation, or edema. Patient with normal composite fist noted. He is with a mildly positive Finklestein on the left. Nontender with normal range of motion in all extremities.  Neurologic:  Normal gross sensation. Normal  intrinsic and opposition testing. Normal UE DTRs bilaterally. Normal speech and language. No gross focal neurologic deficits are appreciated. Skin:  Skin is warm, dry and intact. No rash noted. ___________________________________________   RADIOLOGY  Left Hand  IMPRESSION: No acute bony abnormality.  No significant soft tissue changes.  I, Kalie Cabral, Charlesetta IvoryJenise V Bacon, personally viewed and evaluated these images (plain radiographs) as part of my medical decision making, as well as reviewing the written report by the radiologist. ____________________________________________  PROCEDURES  Thumb spica splint ____________________________________________  INITIAL IMPRESSION / ASSESSMENT AND PLAN / ED COURSE  Patient with a clinical presentation consistent with a left thumb tendinitis. She is fitted with a thumb spica splint and given follow-up instructions for a hand specialist. A prescription for Relafen as provided for anti-inflammatory pain relief. She will follow-up with her PCP for interim care.  ____________________________________________  FINAL CLINICAL IMPRESSION(S) / ED DIAGNOSES  Final diagnoses:  Tendinitis of thumb      Karmen StabsMenshew, Charlesetta IvoryJenise V Bacon, PA-C 01/18/17 0039    Charlynne PanderYao, David Hsienta, MD 01/19/17 1504

## 2017-01-17 NOTE — Discharge Instructions (Signed)
You are being treated for a thumb tendinitis. Wear the splint as needed, when working. Take the anti-inflammatory as directed. Follow-up with Dr. Mathis BudHernandez-Soria for ongoing management.

## 2017-02-27 ENCOUNTER — Encounter: Payer: Self-pay | Admitting: Obstetrics and Gynecology

## 2017-02-27 ENCOUNTER — Ambulatory Visit (INDEPENDENT_AMBULATORY_CARE_PROVIDER_SITE_OTHER): Payer: BLUE CROSS/BLUE SHIELD | Admitting: Obstetrics and Gynecology

## 2017-02-27 VITALS — BP 138/84 | HR 98 | Ht 67.0 in | Wt 254.9 lb

## 2017-02-27 DIAGNOSIS — R7303 Prediabetes: Secondary | ICD-10-CM

## 2017-02-27 DIAGNOSIS — Z01411 Encounter for gynecological examination (general) (routine) with abnormal findings: Secondary | ICD-10-CM | POA: Diagnosis not present

## 2017-02-27 DIAGNOSIS — B379 Candidiasis, unspecified: Secondary | ICD-10-CM

## 2017-02-27 MED ORDER — GLYBURIDE 2.5 MG PO TABS: 2.5000 mg | ORAL_TABLET | Freq: Two times a day (BID) | ORAL | 3 refills | Status: DC

## 2017-02-27 MED ORDER — FLUCONAZOLE 150 MG PO TABS: 150.0000 mg | ORAL_TABLET | Freq: Once | ORAL | 3 refills | Status: AC

## 2017-02-27 NOTE — Progress Notes (Signed)
   Subjective:     Colleen Ryan is a 31 y.o. female and is here for a comprehensive physical exam. The patient reports problems - just finished antibiotics and thinks she has a yeast infection.. Desires seeing if tubes are blocked as a cause of infertility,as she has been taking clomid and cycles are normal at 34-37 days. Reports having PID about 18 months ago.  Couldn't tolerate diarrhea with metformin, has attempted 3 times.  Social History   Social History  . Marital status: Single    Spouse name: N/A  . Number of children: N/A  . Years of education: N/A   Occupational History  . Not on file.   Social History Main Topics  . Smoking status: Current Every Day Smoker    Packs/day: 0.50    Types: Cigarettes  . Smokeless tobacco: Never Used  . Alcohol use No  . Drug use: No  . Sexual activity: Yes    Birth control/ protection: None   Other Topics Concern  . Not on file   Social History Narrative  . No narrative on file   Health Maintenance  Topic Date Due  . HIV Screening  03/22/2003  . TETANUS/TDAP  03/22/2007  . INFLUENZA VACCINE  03/28/2017  . PAP SMEAR  12/15/2018    The following portions of the patient's history were reviewed and updated as appropriate: allergies, current medications, past family history, past medical history, past social history, past surgical history and problem list.  Review of Systems Pertinent items noted in HPI and remainder of comprehensive ROS otherwise negative.   Objective:    General appearance: alert, cooperative, appears stated age and morbidly obese Neck: no adenopathy, no carotid bruit, no JVD, supple, symmetrical, trachea midline and thyroid not enlarged, symmetric, no tenderness/mass/nodules Lungs: clear to auscultation bilaterally Breasts: normal appearance, no masses or tenderness Heart: regular rate and rhythm, S1, S2 normal, no murmur, click, rub or gallop Abdomen: soft, non-tender; bowel sounds normal; no masses,  no  organomegaly Pelvic: cervix normal in appearance, external genitalia normal, no adnexal masses or tenderness, no cervical motion tenderness, rectovaginal septum normal, uterus normal size, shape, and consistency, vagina normal without discharge and .wetprep  Microscopic wet-mount exam shows negative for pathogens, normal epithelial cells, lactobacilli.  Assessment:    Healthy female exam. Obesity Pre-diabetes H/o PID Irregular menses      Plan:     See After Visit Summary for Counseling Recommendations

## 2017-02-27 NOTE — Patient Instructions (Signed)
Preventive Care 29-39 Years, Female Preventive care refers to lifestyle choices and visits with your health care provider that can promote health and wellness. What does preventive care include?  A yearly physical exam. This is also called an annual well check.  Dental exams once or twice a year.  Routine eye exams. Ask your health care provider how often you should have your eyes checked.  Personal lifestyle choices, including: ? Daily care of your teeth and gums. ? Regular physical activity. ? Eating a healthy diet. ? Avoiding tobacco and drug use. ? Limiting alcohol use. ? Practicing safe sex. ? Taking vitamin and mineral supplements as recommended by your health care provider. What happens during an annual well check? The services and screenings done by your health care provider during your annual well check will depend on your age, overall health, lifestyle risk factors, and family history of disease. Counseling Your health care provider may ask you questions about your:  Alcohol use.  Tobacco use.  Drug use.  Emotional well-being.  Home and relationship well-being.  Sexual activity.  Eating habits.  Work and work Statistician.  Method of birth control.  Menstrual cycle.  Pregnancy history.  Screening You may have the following tests or measurements:  Height, weight, and BMI.  Diabetes screening. This is done by checking your blood sugar (glucose) after you have not eaten for a while (fasting).  Blood pressure.  Lipid and cholesterol levels. These may be checked every 5 years starting at age 29.  Skin check.  Hepatitis C blood test.  Hepatitis B blood test.  Sexually transmitted disease (STD) testing.  BRCA-related cancer screening. This may be done if you have a family history of breast, ovarian, tubal, or peritoneal cancers.  Pelvic exam and Pap test. This may be done every 3 years starting at age 29. Starting at age 30, this may be done  every 5 years if you have a Pap test in combination with an HPV test.  Discuss your test results, treatment options, and if necessary, the need for more tests with your health care provider. Vaccines Your health care provider may recommend certain vaccines, such as:  Influenza vaccine. This is recommended every year.  Tetanus, diphtheria, and acellular pertussis (Tdap, Td) vaccine. You may need a Td booster every 10 years.  Varicella vaccine. You may need this if you have not been vaccinated.  HPV vaccine. If you are 29 or younger, you may need three doses over 6 months.  Measles, mumps, and rubella (MMR) vaccine. You may need at least one dose of MMR. You may also need a second dose.  Pneumococcal 13-valent conjugate (PCV13) vaccine. You may need this if you have certain conditions and were not previously vaccinated.  Pneumococcal polysaccharide (PPSV23) vaccine. You may need one or two doses if you smoke cigarettes or if you have certain conditions.  Meningococcal vaccine. One dose is recommended if you are age 29-21 years and a first-year college student living in a residence hall, or if you have one of several medical conditions. You may also need additional booster doses.  Hepatitis A vaccine. You may need this if you have certain conditions or if you travel or work in places where you may be exposed to hepatitis A.  Hepatitis B vaccine. You may need this if you have certain conditions or if you travel or work in places where you may be exposed to hepatitis B.  Haemophilus influenzae type b (Hib) vaccine. You may need this  if you have certain risk factors.  Talk to your health care provider about which screenings and vaccines you need and how often you need them. This information is not intended to replace advice given to you by your health care provider. Make sure you discuss any questions you have with your health care provider. Document Released: 10/10/2001 Document Revised:  05/03/2016 Document Reviewed: 06/15/2015 Elsevier Interactive Patient Education  2017 Elsevier Inc.  

## 2017-02-28 LAB — COMPREHENSIVE METABOLIC PANEL
A/G RATIO: 1.5 (ref 1.2–2.2)
ALBUMIN: 4.1 g/dL (ref 3.5–5.5)
ALT: 17 IU/L (ref 0–32)
AST: 16 IU/L (ref 0–40)
Alkaline Phosphatase: 97 IU/L (ref 39–117)
BILIRUBIN TOTAL: 0.3 mg/dL (ref 0.0–1.2)
BUN / CREAT RATIO: 14 (ref 9–23)
BUN: 11 mg/dL (ref 6–20)
CALCIUM: 9.1 mg/dL (ref 8.7–10.2)
CO2: 26 mmol/L (ref 20–29)
Chloride: 105 mmol/L (ref 96–106)
Creatinine, Ser: 0.78 mg/dL (ref 0.57–1.00)
GFR, EST AFRICAN AMERICAN: 120 mL/min/{1.73_m2} (ref >59)
GFR, EST NON AFRICAN AMERICAN: 104 mL/min/{1.73_m2} (ref >59)
GLOBULIN, TOTAL: 2.8 g/dL (ref 1.5–4.5)
Glucose: 77 mg/dL (ref 65–99)
Potassium: 4.3 mmol/L (ref 3.5–5.2)
Sodium: 143 mmol/L (ref 134–144)
Total Protein: 6.9 g/dL (ref 6.0–8.5)

## 2017-02-28 LAB — LIPID PANEL
CHOL/HDL RATIO: 3.3 ratio (ref 0.0–4.4)
CHOLESTEROL TOTAL: 183 mg/dL (ref 100–199)
HDL: 55 mg/dL (ref >39)
LDL Calculated: 96 mg/dL (ref 0–99)
TRIGLYCERIDES: 160 mg/dL — AB (ref 0–149)
VLDL Cholesterol Cal: 32 mg/dL (ref 5–40)

## 2017-02-28 LAB — TSH: TSH: 1.63 u[IU]/mL (ref 0.450–4.500)

## 2017-02-28 LAB — DHEA-SULFATE: DHEA SO4: 224.8 ug/dL (ref 84.8–378.0)

## 2017-02-28 LAB — HEMOGLOBIN A1C
Est. average glucose Bld gHb Est-mCnc: 108 mg/dL
Hgb A1c MFr Bld: 5.4 % (ref 4.8–5.6)

## 2017-02-28 LAB — PROGESTERONE: Progesterone: 3.2 ng/mL

## 2017-03-06 ENCOUNTER — Encounter: Payer: BLUE CROSS/BLUE SHIELD | Admitting: Obstetrics and Gynecology

## 2017-03-06 ENCOUNTER — Encounter: Payer: Self-pay | Admitting: Obstetrics and Gynecology

## 2017-06-11 ENCOUNTER — Encounter: Payer: Self-pay | Admitting: Obstetrics and Gynecology

## 2017-06-12 ENCOUNTER — Telehealth: Payer: Self-pay | Admitting: Obstetrics and Gynecology

## 2017-06-12 ENCOUNTER — Encounter: Payer: Self-pay | Admitting: *Deleted

## 2017-06-12 NOTE — Telephone Encounter (Signed)
Patient called and stated that she needs another prescription of phentermine (ADIPEX-P) 37.5 MG tablet, The patient would like a call back to speak with Melody or Amy. No other information was disclosed. Please advise.

## 2017-06-15 ENCOUNTER — Ambulatory Visit (INDEPENDENT_AMBULATORY_CARE_PROVIDER_SITE_OTHER): Payer: BLUE CROSS/BLUE SHIELD | Admitting: Obstetrics and Gynecology

## 2017-06-15 ENCOUNTER — Encounter: Payer: Self-pay | Admitting: Obstetrics and Gynecology

## 2017-06-15 VITALS — BP 128/84 | HR 70 | Ht 67.0 in | Wt 253.5 lb

## 2017-06-15 DIAGNOSIS — E669 Obesity, unspecified: Secondary | ICD-10-CM

## 2017-06-15 DIAGNOSIS — G479 Sleep disorder, unspecified: Secondary | ICD-10-CM

## 2017-06-15 DIAGNOSIS — L680 Hirsutism: Secondary | ICD-10-CM | POA: Diagnosis not present

## 2017-06-15 MED ORDER — CYANOCOBALAMIN 1000 MCG/ML IJ SOLN: 1000.0000 ug | INTRAMUSCULAR | 1 refills | Status: DC

## 2017-06-15 MED ORDER — ZOLPIDEM TARTRATE 5 MG PO TABS: 5.0000 mg | ORAL_TABLET | Freq: Every evening | ORAL | 1 refills | Status: DC | PRN

## 2017-06-15 MED ORDER — SPIRONOLACTONE 25 MG PO TABS: 25.0000 mg | ORAL_TABLET | Freq: Every day | ORAL | 6 refills | Status: DC

## 2017-06-15 MED ORDER — PHENTERMINE HCL 37.5 MG PO TABS: 37.5000 mg | ORAL_TABLET | Freq: Every day | ORAL | 2 refills | Status: DC

## 2017-06-15 NOTE — Progress Notes (Signed)
Subjective:  Colleen Ryan is a 31 y.o. G1P0010 at Unknown being seen today for weight loss management- initial visit.  Patient reports General ROS: positive for  - fatigue and only eating once a day, not sleeping-can't fall asleep. and reports previous weight loss attempts:   Previous/Current treatment includes: small frequent feedings, nutritional supplement, vitamin B-12 injections and appetite suppressant.  Pertinent medical history includes: chronic digestive disease, diabetes, eating disorder, anxiety and psychiatric illness.      The following portions of the patient's history were reviewed and updated as appropriate: allergies, current medications, past family history, past medical history, past social history, past surgical history and problem list.   Objective:   Vitals:   06/15/17 0942  BP: 128/84  Pulse: 70  Weight: 253 lb 8 oz (115 kg)  Height: 5\' 7"  (1.702 m)    General:  Alert, oriented and cooperative. Patient is in no acute distress.  :   :   :   :   :   :   PE: Well groomed female in no current distress,   Mental Status: Normal mood and affect. Normal behavior. Normal judgment and thought content.   Current BMI: Body mass index is 39.7 kg/m.   Assessment and Plan:  Obesity    Plan: low carb, High protein diet RX for adipex 37.5 mg daily and B12 1000mcg.ml monthly, to start now with first injection given at today's visit. Reviewed side-effects common to both medications and expected outcomes. Increase daily water intake to at least 8 bottle a day, every day.  Goal is to reduse weight by 10% by end of three months, and will re-evaluate then.  Sleep disturbances- will try ambien, 5mg  at bedtime, instructed on use and side effects.   Hirsutism-couldn't tolerate OCPs as they gave her bad headaches. Will try spirinolactone 25mg  daily.   RTC in 4 weeks for Nurse visit to check weight & BP, and get next B12 injections.    Please refer to After Visit  Summary for other counseling recommendations.    Colleen Ryan, Colleen Ryan Colleen Ryan, CNM   Colleen Ryan, CNM      Consider the Low Glycemic Index Diet and 6 smaller meals daily .  This boosts your metabolism and regulates your sugars:   Use the protein bar by Atkins because they have lots of fiber in them  Find the low carb flatbreads, tortillas and pita breads for sandwiches:  Joseph's makes a pita bread and a flat bread , available at [facility] and [employer]; Toufayah makes a low carb flatbread available at [employer] and [employer] that is 9 net carbs and 100 cal Mission makes a low carb whole wheat tortilla available at [employer],and most grocery stores with 6 net carbs and 210 cal  [place] yogurt can still have a lot of carbs .  Dannon Light Colleen Ryan fit has 80 cal and 8 carbs

## 2017-06-29 ENCOUNTER — Telehealth: Payer: Self-pay | Admitting: Obstetrics and Gynecology

## 2017-06-29 NOTE — Telephone Encounter (Signed)
Patient called stating she has some important questions for you before her next appointment. Thanks

## 2017-07-02 NOTE — Telephone Encounter (Signed)
Pt has noticed lump in her breast will have it checked when she sees [facility]NS 07/16/17

## 2017-07-16 ENCOUNTER — Encounter: Payer: Self-pay | Admitting: Obstetrics and Gynecology

## 2017-07-16 ENCOUNTER — Ambulatory Visit (INDEPENDENT_AMBULATORY_CARE_PROVIDER_SITE_OTHER): Payer: BLUE CROSS/BLUE SHIELD | Admitting: Obstetrics and Gynecology

## 2017-07-16 VITALS — BP 143/88 | HR 70 | Wt 245.5 lb

## 2017-07-16 DIAGNOSIS — E663 Overweight: Secondary | ICD-10-CM

## 2017-07-16 MED ORDER — CYANOCOBALAMIN 1000 MCG/ML IJ SOLN: 1000.0000 ug | Freq: Once | INTRAMUSCULAR | Status: AC

## 2017-07-16 NOTE — Progress Notes (Signed)
Pt is here for wt, bp check, b-12 inj She is doing wonderful with her weight loss, denies any s/e.Pt is having B breast "lump" would like MNS to check for her  07/16/17 wt- 245.5lb 06/15/17 wt- 253lb

## 2017-07-16 NOTE — Patient Instructions (Signed)
Fibrocystic Breast Changes Fibrocystic breast changes are changes in breast tissue that can cause breasts to become swollen, lumpy, or painful. This can happen due to buildup of scar-like tissue (fibrous tissue) or the forming of fluid-filled lumps (cysts) in the breast. This is a common condition, and it is not cancerous (is benign). The exact cause is not known, but it seems to occur when women go through hormonal changes during their menstrual cycle. Fibrocystic breast changes can affect one or both breasts. What are the causes? The exact cause of fibrocystic breast changes is not known. However, this condition:  May be related to the female hormones estrogen and progesterone.  May be influenced by family traits that get passed from parent to child (genetics).  What are the signs or symptoms? Symptoms of this condition may affect one or both breasts, and may include:  Tenderness, mild discomfort, or pain.  Swelling.  Rope-like tissue that can be felt when touching the breast.  Lumps in one or both breasts.  Changes in breast size. Breasts may get larger before the menstrual period and smaller after the menstrual period.  Green or dark brown discharge from the nipple.  Symptoms are usually worse before menstrual periods start, and they get better toward the end of menstrual periods. How is this diagnosed? This condition is diagnosed based on your medical history and a physical exam of your breasts. You may also have tests, such as:  A breast X-ray (mammogram).  Ultrasound of your breasts.  MRI.  Removal of a breast tissue sample for testing (breast biopsy). This may be done if your health care provider thinks that something else may be causing changes in your breasts.  How is this treated? Often, treatment is not needed for this condition. In some cases, treatment may include:  Taking over-the-counter pain relievers to help lessen pain or discomfort.  Limiting or avoiding  caffeine. Foods and beverages that contain caffeine include chocolate, soda, coffee, and tea.  Reducing sugar and fat in your diet.  Your health care provider may also recommend:  A procedure to remove fluid from a cyst that is causing pain (fine needle aspiration).  Surgery to remove a cyst that is large or tender or does not go away.  Follow these instructions at home:  Examine your breasts after every menstrual period. If you do not have menstrual periods, check your breasts on the first day of every month. Feel for changes in your breasts, such as: ? More tenderness. ? A new growth. ? A change in size. ? A change in an existing lump.  Take over-the-counter and prescription medicines only as told by your health care provider.  Wear a well-fitted support or sports bra, especially when exercising.  Decrease or avoid caffeine, fat, and sugar in your diet as directed by your health care provider. Contact a health care provider if:  You have fluid leaking from your nipple, especially if it is bloody.  You have new lumps or bumps in your breast.  Your breast becomes enlarged, red, and painful.  You have areas of your breast that pucker inward.  Your nipple appears flat or indented. Get help right away if:  You have redness of your breast and the redness is spreading. Summary  Fibrocystic breast changes are changes in breast tissue that can cause breasts to become swollen, lumpy, or painful.  This condition may be related to the female hormones estrogen and progesterone.  With this condition, it is important to examine   your breasts after every menstrual period. If you do not have menstrual periods, check your breasts on the first day of every month. This information is not intended to replace advice given to you by your health care provider. Make sure you discuss any questions you have with your health care provider. Document Released: 05/31/2006 Document Revised: 04/25/2016  Document Reviewed: 04/12/2016 Elsevier Interactive Patient Education  2017 Elsevier Inc.   

## 2017-07-16 NOTE — Progress Notes (Incomplete)
Subjective:     Patient ID: Colleen Ryan, female   DOB: 05/11/1986, 31 y.o.   MRN: [id number]  HPI  Felt two breast lumps one on each side 2 weeks ago. Still there and has not changed in size. Accidentally felt when leaning over on inside of arm.   Has had breast cysts in past. Just ended menses.   Feels like pH is off vaginally for last week, is not currently sexually active. And denies changes in discharge.    Review of Systems     Objective:   Physical Exam     Assessment:     ***    Plan:     ***

## 2017-08-15 ENCOUNTER — Encounter: Payer: BLUE CROSS/BLUE SHIELD | Admitting: Obstetrics and Gynecology

## 2017-09-26 ENCOUNTER — Encounter: Payer: Self-pay | Admitting: Obstetrics and Gynecology

## 2017-10-01 ENCOUNTER — Encounter: Payer: Self-pay | Admitting: Obstetrics and Gynecology

## 2017-10-01 ENCOUNTER — Telehealth: Payer: Self-pay | Admitting: *Deleted

## 2017-10-01 DIAGNOSIS — R1310 Dysphagia, unspecified: Secondary | ICD-10-CM

## 2017-10-01 DIAGNOSIS — R1319 Other dysphagia: Secondary | ICD-10-CM

## 2017-10-05 ENCOUNTER — Telehealth: Payer: Self-pay | Admitting: Obstetrics and Gynecology

## 2017-10-05 ENCOUNTER — Other Ambulatory Visit: Payer: Self-pay | Admitting: *Deleted

## 2017-10-05 MED ORDER — METFORMIN HCL 850 MG PO TABS: 850.0000 mg | ORAL_TABLET | Freq: Two times a day (BID) | ORAL | 6 refills | Status: DC

## 2017-10-05 NOTE — Telephone Encounter (Signed)
The patient called and stated that she would like to speak with Amy in regard so having a nurse visit. No other information was disclosed. Please advise.

## 2017-10-05 NOTE — Telephone Encounter (Signed)
Spoke with pt

## 2017-10-09 ENCOUNTER — Ambulatory Visit (INDEPENDENT_AMBULATORY_CARE_PROVIDER_SITE_OTHER): Payer: BLUE CROSS/BLUE SHIELD | Admitting: Certified Nurse Midwife

## 2017-10-09 VITALS — BP 128/74 | HR 98 | Wt 231.4 lb

## 2017-10-09 DIAGNOSIS — E663 Overweight: Secondary | ICD-10-CM | POA: Diagnosis not present

## 2017-10-09 MED ORDER — CYANOCOBALAMIN 1000 MCG/ML IJ SOLN
1000.0000 ug | Freq: Once | INTRAMUSCULAR | Status: AC
  Administered 2017-10-09: 1000 ug via INTRAMUSCULAR

## 2017-10-09 NOTE — Progress Notes (Signed)
I have reviewed the record and concur with patient management and plan.    Allon Costlow Michelle Mansour Balboa, CNM [facility] 

## 2017-10-09 NOTE — Progress Notes (Signed)
Pt is here for wt, bp check, b-11 inj  She is doing well   10/09/17 wt- 231.4lb 07/16/17 wt- 245lb

## 2017-12-06 ENCOUNTER — Other Ambulatory Visit: Payer: Self-pay | Admitting: Obstetrics and Gynecology

## 2017-12-17 ENCOUNTER — Encounter: Payer: Self-pay | Admitting: Obstetrics and Gynecology

## 2017-12-17 ENCOUNTER — Ambulatory Visit: Payer: BLUE CROSS/BLUE SHIELD | Admitting: Obstetrics and Gynecology

## 2017-12-17 VITALS — BP 132/86 | HR 67 | Ht 66.0 in | Wt 221.0 lb

## 2017-12-17 DIAGNOSIS — N3641 Hypermobility of urethra: Secondary | ICD-10-CM

## 2017-12-17 DIAGNOSIS — E669 Obesity, unspecified: Secondary | ICD-10-CM

## 2017-12-17 NOTE — Progress Notes (Signed)
Subjective:     Patient ID: Colleen Ryan, female   DOB: Aug 05, 1986, 32 y.o.   MRN: [id number]  HPI Reports noticing a change in tissue at top of vaginal opening. Saw PCP but wants a second opinion. Has not been sexually active in 6 months. Denies itching, abnormal bleeding or dysuria. Using tampons for menses without difficulty.   Also needs refill on weight loss medications. Is down 30# since last July at [facility]  Review of Systems    Negative except stated in HPI Objective:   Physical Exam A&Ox4 Well groomed female in no distress Blood pressure 132/86, pulse 67, height 5\' 6"  (1.676 m), weight 221 lb (100.2 kg), last menstrual period 12/03/2017. Pelvic exam: urethra normal with slight overgrowth of surrounding tissue. VULVA: normal appearing vulva with no masses, tenderness or lesions, VAGINA: normal appearing vagina with normal color and discharge, no lesions, CERVIX: normal appearing cervix without discharge or lesions.    Assessment:     Urethral hypertrophy Obesity     Plan:    reassured of normal findings. Feel like it is more noticeable due to weight loss. Will continue on weight loss medciation, B12 given and will RTC in 4 weeks as planned.    Melody Shambley,CNM

## 2017-12-21 ENCOUNTER — Telehealth: Payer: Self-pay | Admitting: Obstetrics and Gynecology

## 2017-12-21 NOTE — Telephone Encounter (Signed)
The patient called and stated that she lost her prescription and needed to have Amy send in another script, [facility] [address] [place]. Please advise.

## 2018-01-02 NOTE — Telephone Encounter (Signed)
Pt found her rx

## 2018-01-23 ENCOUNTER — Telehealth: Payer: Self-pay | Admitting: Obstetrics and Gynecology

## 2018-01-23 NOTE — Telephone Encounter (Signed)
Patient called requesting a refill on 800 mg ibuprofen. I do not see it in her chart. Thanks

## 2018-01-28 ENCOUNTER — Encounter: Payer: Self-pay | Admitting: Obstetrics and Gynecology

## 2018-01-28 ENCOUNTER — Ambulatory Visit (INDEPENDENT_AMBULATORY_CARE_PROVIDER_SITE_OTHER): Payer: BLUE CROSS/BLUE SHIELD | Admitting: Obstetrics and Gynecology

## 2018-01-28 VITALS — BP 131/90 | HR 80 | Wt 214.8 lb

## 2018-01-28 DIAGNOSIS — E663 Overweight: Secondary | ICD-10-CM | POA: Diagnosis not present

## 2018-01-28 MED ORDER — CYANOCOBALAMIN 1000 MCG/ML IJ SOLN
1000.0000 ug | Freq: Once | INTRAMUSCULAR | Status: AC
  Administered 2018-01-28: 1000 ug via INTRAMUSCULAR

## 2018-01-28 MED ORDER — IBUPROFEN 800 MG PO TABS: 800.0000 mg | ORAL_TABLET | Freq: Three times a day (TID) | ORAL | 1 refills | Status: AC | PRN

## 2018-01-28 NOTE — Progress Notes (Signed)
Pt is here for wt, bp check, b-12 inj She is doing well, is very happy with her weight loss  01/28/18 wt- 214.8lb 12/17/17 wt- 221lb

## 2018-02-12 ENCOUNTER — Encounter: Payer: Self-pay | Admitting: Obstetrics and Gynecology

## 2018-02-25 ENCOUNTER — Encounter: Payer: Self-pay | Admitting: Obstetrics and Gynecology

## 2018-02-25 ENCOUNTER — Ambulatory Visit (INDEPENDENT_AMBULATORY_CARE_PROVIDER_SITE_OTHER): Payer: BLUE CROSS/BLUE SHIELD | Admitting: Obstetrics and Gynecology

## 2018-02-25 VITALS — BP 135/90 | HR 79 | Ht 66.0 in | Wt 214.1 lb

## 2018-02-25 DIAGNOSIS — E663 Overweight: Secondary | ICD-10-CM | POA: Diagnosis not present

## 2018-02-25 MED ORDER — CYANOCOBALAMIN 1000 MCG/ML IJ SOLN
1000.0000 ug | Freq: Once | INTRAMUSCULAR | Status: AC
  Administered 2018-02-25: 1000 ug via INTRAMUSCULAR

## 2018-02-25 NOTE — Progress Notes (Signed)
Pt is here for wt, bp check, b-12 inj She is doing well  02/25/18 wt- 214.1lb 01/28/18 wt- 214lb

## 2018-03-25 ENCOUNTER — Other Ambulatory Visit: Payer: Self-pay | Admitting: *Deleted

## 2018-03-25 ENCOUNTER — Ambulatory Visit (INDEPENDENT_AMBULATORY_CARE_PROVIDER_SITE_OTHER): Payer: BLUE CROSS/BLUE SHIELD | Admitting: Obstetrics and Gynecology

## 2018-03-25 VITALS — BP 130/90 | HR 70 | Ht 66.0 in | Wt 215.2 lb

## 2018-03-25 DIAGNOSIS — E663 Overweight: Secondary | ICD-10-CM | POA: Diagnosis not present

## 2018-03-25 MED ORDER — CYANOCOBALAMIN 1000 MCG/ML IJ SOLN
1000.0000 ug | Freq: Once | INTRAMUSCULAR | Status: AC
  Administered 2018-03-25: 1000 ug via INTRAMUSCULAR

## 2018-03-25 MED ORDER — DROSPIREN-ETH ESTRAD-LEVOMEFOL 3-0.02-0.451 MG PO TABS: 1.0000 | ORAL_TABLET | Freq: Every day | ORAL | 11 refills | Status: DC

## 2018-03-25 NOTE — Progress Notes (Signed)
Pt is here for wt, bp check, b-12 inj She is doing well, denies any s/e  03/25/18 wt- 215lb 01/28/18 wt- 214lb

## 2018-04-03 ENCOUNTER — Encounter: Payer: BLUE CROSS/BLUE SHIELD | Admitting: Obstetrics and Gynecology

## 2018-04-24 ENCOUNTER — Encounter: Payer: BLUE CROSS/BLUE SHIELD | Admitting: Obstetrics and Gynecology

## 2018-05-01 ENCOUNTER — Ambulatory Visit: Payer: BLUE CROSS/BLUE SHIELD | Admitting: Obstetrics and Gynecology

## 2018-05-01 ENCOUNTER — Encounter: Payer: Self-pay | Admitting: Obstetrics and Gynecology

## 2018-05-01 VITALS — BP 129/86 | HR 76 | Ht 67.0 in | Wt 215.3 lb

## 2018-05-01 DIAGNOSIS — E669 Obesity, unspecified: Secondary | ICD-10-CM | POA: Diagnosis not present

## 2018-05-01 DIAGNOSIS — G479 Sleep disorder, unspecified: Secondary | ICD-10-CM | POA: Diagnosis not present

## 2018-05-01 DIAGNOSIS — Z79899 Other long term (current) drug therapy: Secondary | ICD-10-CM | POA: Diagnosis not present

## 2018-05-01 DIAGNOSIS — Z3009 Encounter for other general counseling and advice on contraception: Secondary | ICD-10-CM | POA: Diagnosis not present

## 2018-05-01 MED ORDER — GLUCOSE BLOOD VI STRP: ORAL_STRIP | 12 refills | Status: DC

## 2018-05-01 MED ORDER — METFORMIN HCL 500 MG PO TABS: 500.0000 mg | ORAL_TABLET | Freq: Two times a day (BID) | ORAL | 4 refills | Status: DC

## 2018-05-01 MED ORDER — NORELGESTROMIN-ETH ESTRADIOL 150-35 MCG/24HR TD PTWK: 1.0000 | MEDICATED_PATCH | TRANSDERMAL | 12 refills | Status: DC

## 2018-05-01 MED ORDER — ACCU-CHEK AVIVA DEVI: 0 refills | Status: AC

## 2018-05-01 MED ORDER — PHENTERMINE HCL 37.5 MG PO TABS: 37.5000 mg | ORAL_TABLET | Freq: Every day | ORAL | 2 refills | Status: DC

## 2018-05-01 MED ORDER — ACCU-CHEK MULTICLIX LANCET DEV KIT: 1.0000 | PACK | Freq: Every day | 2 refills | Status: AC

## 2018-05-01 MED ORDER — CYANOCOBALAMIN 1000 MCG/ML IJ SOLN: 1000.0000 ug | INTRAMUSCULAR | 2 refills | Status: DC

## 2018-05-01 MED ORDER — QUETIAPINE FUMARATE 25 MG PO TABS: 25.0000 mg | ORAL_TABLET | Freq: Every day | ORAL | 2 refills | Status: DC

## 2018-05-01 NOTE — Progress Notes (Signed)
  Subjective:     Patient ID: Colleen Ryan, female   DOB: 1986/08/15, 32 y.o.   MRN: [id number]  HPI Considering BC patch use, and states menses are regular at this time. Is currently sexually active with female partner.   Desires a glucose monitor as she is checking sugar at work. Sugar readings at work 70-100 and has had a few spells that felt like it dropped too low. Feels like she is eating a full breakfast around 9am at work., (typically eating grits, eggs and a muffin.)   States now is working first shift and still not sleeping well, tried up to 10mg  of ambien but it isn't working now. Has tried OTC sleep aides and they stop working after a while. Only sleeping 3-4 hours a night and states it takes her a long time to fall asleep in general.   Denies any major stressors or signs of depression.  Weight is staying stable and desires to continue on weight loss medication. Needs B12 injection.  Never got labs drawn that were ordered last fall.   Review of Systems  Psychiatric/Behavioral: Positive for sleep disturbance.  All other systems reviewed and are negative.      Objective:   Physical Exam A&Ox4 Well groomed female in no distress. Blood pressure 129/86, pulse 76, height 5\' 7"  (1.702 m), weight 215 lb 4.8 oz (97.7 kg), last menstrual period 04/10/2018. Body mass index is 33.72 kg/m.  PE not indicated. Labs obtained.     Assessment:     Medication management Obesity Sleep disturbance New start BC patch Pre-diabetes.     Plan:     Will draw labs and follow up accordingly as she did not have them done when ordered last October. Will switch to 500mg  metformin bid and ordered bood glucose monitored for home use. Encouraged to increase protein intake at each meal.   Will also continue on weight loss plan and B12 given today.  Will try seroquel at bedtime for sleep. Reminded of good sleep habits and regular bedtime and wake times now that she is on first shift.  RTC 4 weeks  for weight check & B12.   >50% of 15 minute visit spent in counseling on above.    Melody Shambley,CNM

## 2018-05-01 NOTE — Patient Instructions (Signed)

## 2018-05-02 LAB — COMPREHENSIVE METABOLIC PANEL
ALK PHOS: 68 IU/L (ref 39–117)
ALT: 9 IU/L (ref 0–32)
AST: 16 IU/L (ref 0–40)
Albumin/Globulin Ratio: 1.4 (ref 1.2–2.2)
Albumin: 4 g/dL (ref 3.5–5.5)
BUN/Creatinine Ratio: 15 (ref 9–23)
BUN: 11 mg/dL (ref 6–20)
Bilirubin Total: 0.7 mg/dL (ref 0.0–1.2)
CO2: 25 mmol/L (ref 20–29)
Calcium: 9.2 mg/dL (ref 8.7–10.2)
Chloride: 104 mmol/L (ref 96–106)
Creatinine, Ser: 0.74 mg/dL (ref 0.57–1.00)
GFR calc Af Amer: 126 mL/min/{1.73_m2} (ref >59)
GFR calc non Af Amer: 109 mL/min/{1.73_m2} (ref >59)
GLUCOSE: 84 mg/dL (ref 65–99)
Globulin, Total: 2.9 g/dL (ref 1.5–4.5)
Potassium: 4.1 mmol/L (ref 3.5–5.2)
Sodium: 143 mmol/L (ref 134–144)
Total Protein: 6.9 g/dL (ref 6.0–8.5)

## 2018-05-02 LAB — HEMOGLOBIN A1C
Est. average glucose Bld gHb Est-mCnc: 100 mg/dL
HEMOGLOBIN A1C: 5.1 % (ref 4.8–5.6)

## 2018-05-02 LAB — PROGESTERONE: Progesterone: 1.7 ng/mL

## 2018-05-02 LAB — TESTOSTERONE: Testosterone: 29 ng/dL (ref 8–48)

## 2018-05-02 LAB — DHEA-SULFATE: DHEA-SO4: 223.3 ug/dL (ref 84.8–378.0)

## 2018-05-02 LAB — ESTRADIOL: Estradiol: 65.5 pg/mL

## 2018-05-02 LAB — INSULIN, RANDOM: INSULIN: 19.3 u[IU]/mL (ref 2.6–24.9)

## 2018-05-27 ENCOUNTER — Ambulatory Visit: Payer: BLUE CROSS/BLUE SHIELD | Admitting: Obstetrics and Gynecology

## 2018-05-29 ENCOUNTER — Ambulatory Visit (INDEPENDENT_AMBULATORY_CARE_PROVIDER_SITE_OTHER): Payer: BLUE CROSS/BLUE SHIELD | Admitting: Obstetrics and Gynecology

## 2018-05-29 ENCOUNTER — Encounter: Payer: Self-pay | Admitting: Obstetrics and Gynecology

## 2018-05-29 VITALS — BP 128/84 | HR 88 | Ht 66.0 in | Wt 217.4 lb

## 2018-05-29 DIAGNOSIS — E669 Obesity, unspecified: Secondary | ICD-10-CM | POA: Diagnosis not present

## 2018-05-29 DIAGNOSIS — M25561 Pain in right knee: Secondary | ICD-10-CM

## 2018-05-29 DIAGNOSIS — Z205 Contact with and (suspected) exposure to viral hepatitis: Secondary | ICD-10-CM

## 2018-05-29 MED ORDER — CYANOCOBALAMIN 1000 MCG/ML IJ SOLN
1000.0000 ug | Freq: Once | INTRAMUSCULAR | Status: AC
  Administered 2018-05-29: 1000 ug via INTRAMUSCULAR

## 2018-05-29 MED ORDER — NAPROXEN 500 MG PO TABS: 500.0000 mg | ORAL_TABLET | Freq: Two times a day (BID) | ORAL | 2 refills | Status: AC

## 2018-05-29 NOTE — Patient Instructions (Signed)
Knee Pain, Adult Knee pain in adults is common. It can be caused by many things, including:  Arthritis.  A fluid-filled sac (cyst) or growth in your knee.  An infection in your knee.  An injury that will not heal.  Damage, swelling, or irritation of the tissues that support your knee.  Knee pain is usually not a sign of a serious problem. The pain may go away on its own with time and rest. If it does not, a health care provider may order tests to find the cause of the pain. These may include:  Imaging tests, such as an X-ray, MRI, or ultrasound.  Joint aspiration. In this test, fluid is removed from the knee.  Arthroscopy. In this test, a lighted tube is inserted into knee and an image is projected onto a TV screen.  A biopsy. In this test, a sample of tissue is removed from the body and studied under a microscope.  Follow these instructions at home: Pay attention to any changes in your symptoms. Take these actions to relieve your pain. Activity  Rest your knee.  Do not do things that cause pain or make pain worse.  Avoid high-impact activities or exercises, such as running, jumping rope, or doing jumping jacks. General instructions  Take over-the-counter and prescription medicines only as told by your health care provider.  Raise (elevate) your knee above the level of your heart when you are sitting or lying down.  Sleep with a pillow under your knee.  If directed, apply ice to the knee: ? Put ice in a plastic bag. ? Place a towel between your skin and the bag. ? Leave the ice on for 20 minutes, 2-3 times a day.  Ask your health care provider if you should wear an elastic knee support.  Lose weight if you are overweight. Extra weight can put pressure on your knee.  Do not use any products that contain nicotine or tobacco, such as cigarettes and e-cigarettes. Smoking may slow the healing of any bone and joint problems that you may have. If you need help quitting, ask  your health care provider. Contact a health care provider if:  Your knee pain continues, changes, or gets worse.  You have a fever along with knee pain.  Your knee buckles or locks up.  Your knee swells, and the swelling becomes worse. Get help right away if:  Your knee feels warm to the touch.  You cannot move your knee.  You have severe pain in your knee.  You have chest pain.  You have trouble breathing. Summary  Knee pain in adults is common. It can be caused by many things, including, arthritis, infection, cysts, or injury.  Knee pain is usually not a sign of a serious problem, but if it does not go away, a health care provider may perform tests to know the cause of the pain.  Pay attention to any changes in your symptoms. Relieve your pain with rest, medicines, light activity, and use of ice.  Get help if your pain continues or becomes very severe, or if your knee buckles or locks up, or if you have chest pain or trouble breathing. This information is not intended to replace advice given to you by your health care provider. Make sure you discuss any questions you have with your health care provider. Document Released: 06/11/2007 Document Revised: 08/04/2016 Document Reviewed: 08/04/2016 Elsevier Interactive Patient Education  2018 [employer]. Hepatitis A Hepatitis A is a viral infection of  the liver. The virus causes inflammation in the liver and it can be passed from person to person (is contagious). Most cases of hepatitis A are fairly mild and people recover fully. The hepatitis A vaccine can prevent this condition. What are the causes? This condition is caused by the hepatitis A virus (HAV). The virus may be spread by:  Drinking or eating unclean (contaminated) food or water.  Having sex with with someone who is infected.  Coming into contact with the stool (feces) of a person who is infected and passing the virus from your hands to your mouth.  What increases  the risk? The following factors make you more likely to develop this condition:  Having contact with contaminated needles or syringes. This may happen during: ? Acupuncture. ? Tattooing. ? Body piercing. ? Injecting drugs.  Not having access to clean water or food.  Working at a day care or nursing home. Working in these facilities increases the risk of getting this infection because you are in contact with feces during diaper changes or general hygiene practices.  Having HIV (human immunodeficiency virus) or AIDS (acquired immunodeficiency syndrome).  Living in or traveling to countries where hepatitis A is common.  Being a man who has sex with men.  Having oral or anal sex.  Having hemophilia or another blood clotting factor disorder.  Having long-term (chronic) liver disease.  What are the signs or symptoms? Symptoms of this condition include:  Loss of appetite.  Fatigue.  Nausea.  Vomiting.  Stomach pain.  Dark yellow urine.  Yellowing of the skin and eyes (jaundice).  Fever.  Itchy skin.  Light-colored bowel movements.  Joint pain.  In some cases, you may not have any symptoms. How is this diagnosed? This condition is diagnosed based on:  A physical exam.  Your medical history.  Blood tests.  How is this treated? This condition usually goes away on its own over several weeks or months. There is no specific treatment for the disease after the virus has caused the infection. Severe cases of hepatitis A may require hospitalization to treat dehydration and to monitor liver function, but this is rare. Follow these instructions at home: Medicines  Take over-the-counter and prescription medicines only as told by your health care provider.  Do not take over-the-counter medicines that contain acetaminophen.  Do not take any new medicines, including over-the-counter medicines and supplements, unless approved by your health care  provider. Lifestyle  Rest. Make sure you: ? Get plenty of sleep. Avoid staying up late. ? Keep the same bedtime hours on weekends and weekdays. ? Take daytime naps or rest breaks when you feel tired.  Do not have sex unless approved by your health care provider.  Eat a balanced diet with plenty of fruits and vegetables, whole grains, and low-fat (lean) meats or other non-meat proteins (such as beans or tofu).  Do not drink alcohol until your health care provider approves. General instructions  Wash your hands frequently with soap and water, especially after using the bathroom, after changing diapers, and before handling food or water. If soap and water are not available, use hand sanitizer.  Tell your health care provider about all of the people you live with or with whom you have close contact. Your health care provider may recommend that they receive the hepatitis A vaccine.  Follow your health care provider's instructions about how to avoid spreading the virus.  Ask your health care provider when you may return to school or  work.  Keep all follow-up visits as told by your health care provider. This is important. How is this prevented?  Get the hepatitis A vaccine. This helps prevent the hepatitis A infection.  If you have been recently exposed to hepatitis A, your health care provider may recommend that you get a shot of human immunoglobulin or the hepatitis A vaccine. This may help you prevent hepatitis A.  Wash your hands frequently with soap and water, especially after using the bathroom or changing diapers, and before handling food or water. If soap and water are not available, use hand sanitizer.  If you travel to a developing country: ? Avoid raw or under-cooked food. ? Drink bottled water only. ? Use bottled water to brush your teeth, make ice cubes, and wash fruits and vegetables.  Practice safe sex. Always use condoms when having oral, vaginal, or anal sex. Contact a  health care provider if:  You have a fever.  Your symptoms get worse. Get help right away if:  You are unable to eat or drink.  You cannot eat or drink without vomiting.  You feel confused.  Your jaundice gets worse.  You are very sleepy or have trouble waking up.  You have uncontrolled bleeding or bruising. Summary  Hepatitis A is a viral infection of the liver. The virus causes inflammation in the liver and it can be passed from person to person (is contagious).  The hepatitis A virus (HAV) can be spread by drinking or eating unclean (contaminated) food or water.  You should not take any new medicines, including over-the-counter medicines and supplements, unless they are approved by your health care provider.  To help prevent hepatitis A, wash your hands frequently with soap and water, especially after using the bathroom or changing diapers, and before handling food or water. If soap and water are not available, use hand sanitizer. This information is not intended to replace advice given to you by your health care provider. Make sure you discuss any questions you have with your health care provider. Document Released: 08/11/2000 Document Revised: 09/19/2016 Document Reviewed: 09/19/2016 Elsevier Interactive Patient Education  [employer].

## 2018-05-29 NOTE — Progress Notes (Signed)
SUBJECTIVE:  32 y.o. here for follow-up weight loss visit, previously seen 4 weeks ago. Denies any concerns and feels like 2# weight gain is "Happy weight" as she is in new relationship.  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   Reports possible exposure to hepatitis A with patient in nursing home- desires testing. Lastly, is concerned about right posterior knee pain for the last 3-4 days, worse after working (12 hour shifts) and in the morning feels very stiff. Does report h/o having to wear knee splints in middle school for some time but not sure why. Denies any recent injury or trauma. Low back is starting to hurt too as she is shifting weight to left leg. Tried motrin with some relief.  OBJECTIVE:  BP 128/84   Pulse 88   Ht 5\' 6"  (1.676 m)   Wt 217 lb 6.4 oz (98.6 kg)   LMP 05/11/2018   BMI 35.09 kg/m   Body mass index is 35.09 kg/m. Patient appears well. Waist circ.40" Right knee without edema or crepitous noted, does reports tenderness behind knee with palpation.   ASSESSMENT:   Obesity Posterior right knee pain Needs flu vaccine Possible exposure at work to hep A  PLAN:  To continue with current medications. B12 1040mcg/ml injection given, discussed need for 3-5% weight loss before next visit to continue medication.   Declines flu vaccine.  Referred to Dr Antoine Primas for evaluation of right knee pain.encouraged ice for 20-30 minutes daily and OK to use knee brace between now and visit.  Labs obtained for hepatitis panel- will follow up accordingly.  RTC in 4 weeks as planned  Melody [place], CNM

## 2018-05-30 LAB — HEPATITIS PANEL, ACUTE
HEP B C IGM: NEGATIVE
Hep A IgM: NEGATIVE
Hepatitis B Surface Ag: NEGATIVE

## 2018-06-03 NOTE — Progress Notes (Deleted)
[facility] [place], [place] [address] Phone: [phone] Subjective:    I'm seeing this patient by the request  of:  Shambley CNM  CC: Right knee pain  BJY:[id number]  Colleen Ryan is a 32 y.o. female coming in with complaint of ***  Onset-  Location Duration-  Character- Aggravating factors- Reliving factors-  Therapies tried-  Severity-     Past Medical History:  Diagnosis Date  . Genital herpes   . Insomnia   . Overweight   . Polycystic ovarian disease    No past surgical history on file. Social History   Socioeconomic History  . Marital status: Single    Spouse name: Not on file  . Number of children: Not on file  . Years of education: Not on file  . Highest education level: Not on file  Occupational History  . Not on file  Social Needs  . Financial resource strain: Not on file  . Food insecurity:    Worry: Not on file    Inability: Not on file  . Transportation needs:    Medical: Not on file    Non-medical: Not on file  Tobacco Use  . Smoking status: Current Every Day Smoker    Packs/day: 0.50    Types: Cigarettes  . Smokeless tobacco: Never Used  Substance and Sexual Activity  . Alcohol use: No  . Drug use: No  . Sexual activity: Yes    Birth control/protection: None  Lifestyle  . Physical activity:    Days per week: Not on file    Minutes per session: Not on file  . Stress: Not on file  Relationships  . Social connections:    Talks on phone: Not on file    Gets together: Not on file    Attends religious service: Not on file    Active member of club or organization: Not on file    Attends meetings of clubs or organizations: Not on file    Relationship status: Not on file  Other Topics Concern  . Not on file  Social History Narrative  . Not on file   Allergies  Allergen Reactions  . Bactrim [Sulfamethoxazole-Trimethoprim] Hives   No family history on file.  Current Outpatient  Medications (Endocrine & Metabolic):  .  metFORMIN (GLUCOPHAGE) 500 MG tablet, Take 1 tablet (500 mg total) by mouth 2 (two) times daily with a meal. .  norelgestromin-ethinyl estradiol (ORTHO EVRA) 150-35 MCG/24HR transdermal patch, Place 1 patch onto the skin once a week.       Current Outpatient Medications (Analgesics):  .  ibuprofen (ADVIL,MOTRIN) 800 MG tablet, Take 1 tablet (800 mg total) by mouth every 8 (eight) hours as needed. (Patient not taking: Reported on 05/01/2018) .  naproxen (NAPROSYN) 500 MG tablet, Take 1 tablet (500 mg total) by mouth 2 (two) times daily with a meal. As needed for pain   Current Outpatient Medications (Hematological):  .  cyanocobalamin (,VITAMIN B-12,) 1000 MCG/ML injection, Inject 1 mL (1,000 mcg total) into the skin every 30 (thirty) days.  Current Facility-Administered Medications (Hematological):  .  cyanocobalamin ((VITAMIN B-12)) injection 1,000 mcg  Current Outpatient Medications (Other):  .  Blood Glucose Monitoring Suppl (ACCU-CHEK AVIVA) device, Use as instructed .  glucose blood (ACCU-CHEK ACTIVE STRIPS) test strip, Use as instructed .  Lancets Misc. (ACCU-CHEK MULTICLIX LANCET DEV) KIT, 1 Device by Does not apply route daily. .  phentermine (ADIPEX-P) 37.5 MG tablet, Take 1 tablet (  37.5 mg total) by mouth daily before breakfast.     Past medical history, social, surgical and family history all reviewed in electronic medical record.  No pertanent information unless stated regarding to the chief complaint.   Review of Systems:  No headache, visual changes, nausea, vomiting, diarrhea, constipation, dizziness, abdominal pain, skin rash, fevers, chills, night sweats, weight loss, swollen lymph nodes, body aches, joint swelling, muscle aches, chest pain, shortness of breath, mood changes.   Objective  Last menstrual period 05/11/2018. Systems examined below as of    General: No apparent distress alert and oriented x3 mood and affect  normal, dressed appropriately.  HEENT: Pupils equal, extraocular movements intact  Respiratory: Patient's speak in full sentences and does not appear short of breath  Cardiovascular: No lower extremity edema, non tender, no erythema  Skin: Warm dry intact with no signs of infection or rash on extremities or on axial skeleton.  Abdomen: Soft nontender  Neuro: Cranial nerves II through XII are intact, neurovascularly intact in all extremities with 2+ DTRs and 2+ pulses.  Lymph: No lymphadenopathy of posterior or anterior cervical chain or axillae bilaterally.  Gait normal with good balance and coordination.  MSK:  Non tender with full range of motion and good stability and symmetric strength and tone of shoulders, elbows, wrist, hip and ankles bilaterally.  Knee: Normal to inspection with no erythema or effusion or obvious bony abnormalities. Palpation normal with no warmth, joint line tenderness, patellar tenderness, or condyle tenderness. ROM full in flexion and extension and lower leg rotation. Ligaments with solid consistent endpoints including ACL, PCL, LCL, MCL. Negative Mcmurray's, Apley's, and Thessalonian tests. Non painful patellar compression. Patellar glide without crepitus. Patellar and quadriceps tendons unremarkable. Hamstring and quadriceps strength is normal.   Impression and Recommendations:     This case required medical decision making of moderate complexity. The above documentation has been reviewed and is accurate and complete Lyndal Pulley, DO       Note: This dictation was prepared with Dragon dictation along with smaller phrase technology. Any transcriptional errors that result from this process are unintentional.

## 2018-06-04 ENCOUNTER — Ambulatory Visit: Payer: Self-pay | Admitting: Family Medicine

## 2018-06-04 DIAGNOSIS — Z0289 Encounter for other administrative examinations: Secondary | ICD-10-CM

## 2018-06-26 ENCOUNTER — Encounter: Payer: BLUE CROSS/BLUE SHIELD | Admitting: Obstetrics and Gynecology

## 2018-07-02 ENCOUNTER — Ambulatory Visit: Payer: BLUE CROSS/BLUE SHIELD | Admitting: Obstetrics and Gynecology

## 2018-07-02 ENCOUNTER — Encounter: Payer: Self-pay | Admitting: Obstetrics and Gynecology

## 2018-07-02 VITALS — BP 136/85 | HR 76 | Ht 66.0 in | Wt 215.9 lb

## 2018-07-02 DIAGNOSIS — E669 Obesity, unspecified: Secondary | ICD-10-CM

## 2018-07-02 MED ORDER — CYANOCOBALAMIN 1000 MCG/ML IJ SOLN
1000.0000 ug | Freq: Once | INTRAMUSCULAR | Status: AC
  Administered 2018-07-02: 1000 ug via INTRAMUSCULAR

## 2018-07-02 MED ORDER — PHENTERMINE HCL 37.5 MG PO TABS: 37.5000 mg | ORAL_TABLET | Freq: Every day | ORAL | 2 refills | Status: DC

## 2018-07-02 NOTE — Progress Notes (Signed)
Pt is here for wt, bp check, b-12 inj She is doing well  07/02/18 wt- 215lb 05/29/18 wt- 217lb  Waist-40 in  Is feeling better and knee pain has resolved. Lost written prescription for adipex so hasn't been taking it for 2 weeks.0Is still exercising and working on weight loss. Desires to continue with medication until weight down to 170-180, as she desires trying for a pregnancy next year.   Melody Shambley,CNM

## 2018-08-01 ENCOUNTER — Encounter: Payer: BLUE CROSS/BLUE SHIELD | Admitting: Obstetrics and Gynecology

## 2019-01-06 ENCOUNTER — Telehealth: Payer: Self-pay | Admitting: Obstetrics and Gynecology

## 2019-01-07 NOTE — Telephone Encounter (Signed)
error 

## 2019-01-16 ENCOUNTER — Telehealth: Payer: Self-pay | Admitting: *Deleted

## 2019-01-16 NOTE — Telephone Encounter (Signed)
Coronavirus (COVID-19) Are you at risk?  Are you at risk for the Coronavirus (COVID-19)?  To be considered HIGH RISK for Coronavirus (COVID-19), you have to meet the following criteria:  . Traveled to [place], [place], [place], [place] or [place]; or in the [place] to [place], [place], [place], or [place]; and have fever, cough, and shortness of breath within the last 2 weeks of travel OR . Been in close contact with a person diagnosed with COVID-19 within the last 2 weeks and have fever, cough, and shortness of breath . IF YOU DO NOT MEET THESE CRITERIA, YOU ARE CONSIDERED LOW RISK FOR COVID-19.  What to do if you are HIGH RISK for COVID-19?  . If you are having a medical emergency, call 911. . Seek medical care right away. Before you go to a doctor's office, urgent care or emergency department, call ahead and tell them about your recent travel, contact with someone diagnosed with COVID-19, and your symptoms. You should receive instructions from your physician's office regarding next steps of care.  . When you arrive at healthcare provider, tell the healthcare staff immediately you have returned from visiting [place], [place], [place], [place] or [place]; or traveled in the [place] to [place], [place], [place], or [place]; in the last two weeks or you have been in close contact with a person diagnosed with COVID-19 in the last 2 weeks.   . Tell the health care staff about your symptoms: fever, cough and shortness of breath. . After you have been seen by a medical provider, you will be either: o Tested for (COVID-19) and discharged home on quarantine except to seek medical care if symptoms worsen, and asked to  - Stay home and avoid contact with others until you get your results (4-5 days)  - Avoid travel on public transportation if possible (such as bus, train, or airplane) or o Sent to the Emergency Department by EMS for evaluation, COVID-19 testing, and possible  admission depending on your condition and test results.  What to do if you are LOW RISK for COVID-19?  Reduce your risk of any infection by using the same precautions used for avoiding the common cold or flu:  . Wash your hands often with soap and warm water for at least 20 seconds.  If soap and water are not readily available, use an alcohol-based hand sanitizer with at least 60% alcohol.  . If coughing or sneezing, cover your mouth and nose by coughing or sneezing into the elbow areas of your shirt or coat, into a tissue or into your sleeve (not your hands). . Avoid shaking hands with others and consider head nods or verbal greetings only. . Avoid touching your eyes, nose, or mouth with unwashed hands.  . Avoid close contact with people who are sick. . Avoid places or events with large numbers of people in one location, like concerts or sporting events. . Carefully consider travel plans you have or are making. . If you are planning any travel outside or inside the [place], visit the CDC's Travelers' Health webpage for the latest health notices. . If you have some symptoms but not all symptoms, continue to monitor at home and seek medical attention if your symptoms worsen. . If you are having a medical emergency, call 911.   ADDITIONAL HEALTHCARE OPTIONS FOR PATIENTS  [facility] / e-Visit: [email or web address]         [facility]: [phone]  [facility]: [phone]                   [facility]: [phone]   Spoke with pt denies any sx.  Lela Gell, CMA 

## 2019-01-17 ENCOUNTER — Ambulatory Visit (INDEPENDENT_AMBULATORY_CARE_PROVIDER_SITE_OTHER): Payer: BLUE CROSS/BLUE SHIELD | Admitting: Obstetrics and Gynecology

## 2019-01-17 ENCOUNTER — Encounter: Payer: Self-pay | Admitting: Obstetrics and Gynecology

## 2019-01-17 ENCOUNTER — Other Ambulatory Visit: Payer: Self-pay

## 2019-01-17 VITALS — BP 120/72 | HR 73 | Ht 67.0 in | Wt 232.1 lb

## 2019-01-17 DIAGNOSIS — E669 Obesity, unspecified: Secondary | ICD-10-CM | POA: Diagnosis not present

## 2019-01-17 DIAGNOSIS — Z6836 Body mass index (BMI) 36.0-36.9, adult: Secondary | ICD-10-CM

## 2019-01-17 MED ORDER — CYANOCOBALAMIN 1000 MCG/ML IJ SOLN: 1000.0000 ug | INTRAMUSCULAR | 2 refills | Status: AC

## 2019-01-17 MED ORDER — PHENTERMINE HCL 37.5 MG PO TABS: 37.5000 mg | ORAL_TABLET | Freq: Every day | ORAL | 2 refills | Status: DC

## 2019-01-17 NOTE — Progress Notes (Signed)
Subjective:  Colleen Ryan is a 33 y.o. G1P0010 at Unknown being seen today for weight loss management- initial visit.  Patient reports General ROS: negative and reports previous weight loss attempts:were successful on adipex. Still desires conception in near future and desires weight down before then.  Management changes made at the last visit include: Ran out of adipex 2 months ago. And hasn't been going to gym due to pandemic. Gain 15 lbs back. Is still working FT.     The following portions of the patient's history were reviewed and updated as appropriate: allergies, current medications, past family history, past medical history, past social history, past surgical history and problem list.   Objective:   Vitals:   01/17/19 1403  BP: 120/72  Pulse: 73  Weight: 232 lb 1.6 oz (105.3 kg)  Height: 5\' 7"  (1.702 m)    General:  Alert, oriented and cooperative. Patient is in no acute distress.  :   :   :   :   :   :   PE: Well groomed female in no current distress,   Mental Status: Normal mood and affect. Normal behavior. Normal judgment and thought content.   Current BMI: Body mass index is 36.35 kg/m.   Assessment and Plan:  Obesity  There are no diagnoses linked to this encounter.  Plan: low carb, High protein diet RX for adipex 37.5 mg daily and B12 .ml monthly, to start now with first injection given at today's visit. Reviewed side-effects common to both medications and expected outcomes. Increase daily water intake to at least 8 bottle a day, every day.  Goal is to reduse weight by 10% by end of three months, and will re-evaluate then.  RTC in 4 weeks for Nurse visit to check weight & BP, and get next B12 injections.    Please refer to After Visit Summary for other counseling recommendations.    [place], Zyiere Rosemond N, CNM   [place], CNM      Consider the Low Glycemic Index Diet and 6 smaller meals daily .  This boosts your metabolism and  regulates your sugars:   Use the protein bar by [employer]kins because they have lots of fiber in them  Find the low carb flatbreads, tortillas and pita breads for sandwiches:  Joseph's makes a pita bread and a flat bread , available at [facility] and [employer]; Toufayah makes a low carb flatbread available at [employer] and HT that is 9 net carbs and 100 cal [place] makes a low carb whole wheat tortilla available at [employer] most grocery stores with 6 net carbs and 210 cal  [place] yogurt can still have a lot of carbs .  Dannon Light N fit has 80 cal and 8 carbs

## 2019-02-04 ENCOUNTER — Encounter: Payer: Self-pay | Admitting: *Deleted

## 2019-02-04 ENCOUNTER — Telehealth: Payer: Self-pay | Admitting: Obstetrics and Gynecology

## 2019-02-04 NOTE — Telephone Encounter (Signed)
Sent pt [id number]-ac 

## 2019-02-04 NOTE — Telephone Encounter (Signed)
The patient called and stated that she hurt her foot at work and is infected, The patient needs an antibiotic. Please advise.

## 2019-02-13 ENCOUNTER — Encounter: Payer: BLUE CROSS/BLUE SHIELD | Admitting: Obstetrics and Gynecology

## 2019-03-19 ENCOUNTER — Other Ambulatory Visit: Payer: Self-pay | Admitting: Obstetrics and Gynecology

## 2019-03-19 NOTE — Telephone Encounter (Signed)
Printed and awaiting signature.

## 2019-04-25 ENCOUNTER — Encounter: Payer: BC Managed Care – PPO | Admitting: Obstetrics and Gynecology

## 2019-05-09 ENCOUNTER — Encounter: Payer: BC Managed Care – PPO | Admitting: Obstetrics and Gynecology

## 2019-11-20 ENCOUNTER — Encounter: Payer: Self-pay | Admitting: Certified Nurse Midwife

## 2019-11-20 ENCOUNTER — Other Ambulatory Visit: Payer: Self-pay

## 2019-11-20 ENCOUNTER — Ambulatory Visit (INDEPENDENT_AMBULATORY_CARE_PROVIDER_SITE_OTHER): Payer: BC Managed Care – PPO | Admitting: Certified Nurse Midwife

## 2019-11-20 ENCOUNTER — Other Ambulatory Visit (HOSPITAL_COMMUNITY)
Admission: RE | Admit: 2019-11-20 | Discharge: 2019-11-20 | Disposition: A | Discharge status: Home / Self Care | Payer: BC Managed Care – PPO | Source: Ambulatory Visit | Attending: Certified Nurse Midwife | Admitting: Certified Nurse Midwife

## 2019-11-20 VITALS — BP 117/75 | HR 79 | Ht 67.0 in | Wt 244.1 lb

## 2019-11-20 DIAGNOSIS — Z124 Encounter for screening for malignant neoplasm of cervix: Secondary | ICD-10-CM | POA: Insufficient documentation

## 2019-11-20 DIAGNOSIS — Z01419 Encounter for gynecological examination (general) (routine) without abnormal findings: Secondary | ICD-10-CM | POA: Insufficient documentation

## 2019-11-20 DIAGNOSIS — N946 Dysmenorrhea, unspecified: Secondary | ICD-10-CM

## 2019-11-20 DIAGNOSIS — E282 Polycystic ovarian syndrome: Secondary | ICD-10-CM

## 2019-11-20 DIAGNOSIS — Z113 Encounter for screening for infections with a predominantly sexual mode of transmission: Secondary | ICD-10-CM

## 2019-11-20 DIAGNOSIS — E669 Obesity, unspecified: Secondary | ICD-10-CM

## 2019-11-20 DIAGNOSIS — Z0000 Encounter for general adult medical examination without abnormal findings: Secondary | ICD-10-CM | POA: Diagnosis not present

## 2019-11-20 MED ORDER — METFORMIN HCL 500 MG PO TABS: ORAL_TABLET | ORAL | 5 refills | Status: DC

## 2019-11-20 MED ORDER — MAGNESIUM OXIDE -MG SUPPLEMENT 400 (240 MG) MG PO TABS: 1.0000 | ORAL_TABLET | Freq: Two times a day (BID) | ORAL | 2 refills | Status: AC

## 2019-11-20 NOTE — Progress Notes (Addendum)
ANNUAL PREVENTATIVE CARE GYN  ENCOUNTER NOTE  Subjective:       Colleen Ryan is a 34 y.o. G72P0010 female here for a routine annual gynecologic exam.  Current complaints: 1. Desires pregnancy-unsuccessful over the last year 2. Wishes to restart PCOS medication  Denies difficulty breathing or respiratory distress, chest pain, abdominal pain, excessive vaginal bleeding, dysuria, and leg pain or swelling.    Gynecologic History  Patient's last menstrual period was 11/03/2019 (exact date). Period Cycle (Days): 28 Period Duration (Days): 6 Period Pattern: Regular Menstrual Flow: Light, Moderate, Heavy Menstrual Control: Panty liner, Thin pad, Maxi pad, Tampon Dysmenorrhea: (!) Severe Dysmenorrhea Symptoms: Cramping, Headache  Contraception: none  Last Pap: due.   Obstetric History  OB History  Gravida Para Term Preterm AB Living  1       1    SAB TAB Ectopic Multiple Live Births    1          # Outcome Date GA Lbr Len/2nd Weight Sex Delivery Anes PTL Lv  1 TAB 2015            Past Medical History:  Diagnosis Date  . Genital herpes   . Insomnia   . Overweight   . Polycystic ovarian disease     No past surgical history on file.  Current Outpatient Medications on File Prior to Visit  Medication Sig Dispense Refill  . glucose blood (ACCU-CHEK ACTIVE STRIPS) test strip Use as instructed 100 each 12  . ibuprofen (ADVIL,MOTRIN) 800 MG tablet Take 1 tablet (800 mg total) by mouth every 8 (eight) hours as needed. 60 tablet 1  . Lancets Misc. (ACCU-CHEK MULTICLIX LANCET DEV) KIT 1 Device by Does not apply route daily. 100 each 2  . cyanocobalamin (,VITAMIN B-12,) 1000 MCG/ML injection Inject 1 mL (1,000 mcg total) into the skin every 30 (thirty) days. (Patient not taking: Reported on 11/20/2019) 10 mL 2  . metFORMIN (GLUCOPHAGE) 500 MG tablet Take 1 tablet (500 mg total) by mouth 2 (two) times daily with a meal. (Patient not taking: Reported on 11/20/2019) 60 tablet 4  .  naproxen (NAPROSYN) 500 MG tablet Take 1 tablet (500 mg total) by mouth 2 (two) times daily with a meal. As needed for pain (Patient not taking: Reported on 01/17/2019) 60 tablet 2  . phentermine (ADIPEX-P) 37.5 MG tablet TAKE 1 TABLET BY MOUTH ONCE DAILY BEFORE BREAKFAST (Patient not taking: Reported on 11/20/2019) 30 tablet 0   Current Facility-Administered Medications on File Prior to Visit  Medication Dose Route Frequency Provider Last Rate Last Admin  . cyanocobalamin ((VITAMIN B-12)) injection 1,000 mcg  1,000 mcg Intramuscular Once Shambley, Melody N, CNM        Allergies  Allergen Reactions  . Bactrim [Sulfamethoxazole-Trimethoprim] Hives    Social History   Socioeconomic History  . Marital status: Single    Spouse name: Not on file  . Number of children: Not on file  . Years of education: Not on file  . Highest education level: Not on file  Occupational History  . Not on file  Tobacco Use  . Smoking status: Former Smoker    Packs/day: 0.50    Types: Cigarettes  . Smokeless tobacco: Never Used  Substance and Sexual Activity  . Alcohol use: No  . Drug use: No  . Sexual activity: Yes    Birth control/protection: None  Other Topics Concern  . Not on file  Social History Narrative  . Not on file   Social Determinants  of Health   Financial Resource Strain:   . Difficulty of Paying Living Expenses:   Food Insecurity:   . Worried About Charity fundraiser in the Last Year:   . Arboriculturist in the Last Year:   Transportation Needs:   . Film/video editor (Medical):   Marland Kitchen Lack of Transportation (Non-Medical):   Physical Activity:   . Days of Exercise per Week:   . Minutes of Exercise per Session:   Stress:   . Feeling of Stress :   Social Connections:   . Frequency of Communication with Friends and Family:   . Frequency of Social Gatherings with Friends and Family:   . Attends Religious Services:   . Active Member of Clubs or Organizations:   . Attends English as a second language teacher Meetings:   Marland Kitchen Marital Status:   Intimate Partner Violence:   . Fear of Current or Ex-Partner:   . Emotionally Abused:   Marland Kitchen Physically Abused:   . Sexually Abused:     The following portions of the patient's history were reviewed and updated as appropriate: allergies, current medications, past family history, past medical history, past social history, past surgical history and problem list.  Review of Systems  ROS negative except as noted above. Information obtained from patient.    Objective:   BP 117/75   Pulse 79   Ht 5' 7"  (1.702 m)   Wt 244 lb 1 oz (110.7 kg)   LMP 11/03/2019 (Exact Date)   BMI 38.23 kg/m    CONSTITUTIONAL: Well-developed, well-nourished female in no acute distress.   PSYCHIATRIC: Normal mood and affect. Normal behavior. Normal judgment and thought content.  [place]: Alert and oriented to person, place, and time. Normal muscle tone coordination. No cranial nerve deficit noted.  HENT:  Normocephalic, atraumatic, External right and left ear normal.   EYES: Conjunctivae and EOM are normal. Pupils are equal and round.   NECK: Normal range of motion, supple, no masses.  Normal thyroid.   SKIN: Skin is warm and dry. No rash noted. Not diaphoretic. No erythema. No pallor. Professional tattoos present.   CARDIOVASCULAR: Normal heart rate noted, regular rhythm, no murmur.  RESPIRATORY: Clear to auscultation bilaterally. Effort and breath sounds normal, no problems with respiration noted.  BREASTS: Symmetric in size. No masses, skin changes, nipple drainage, or lymphadenopathy. Bilateral nipple piercings present.   ABDOMEN: Soft, normal bowel sounds, no distention noted.  No tenderness, rebound or guarding.   PELVIC:  External Genitalia: Normal  Vagina: Normal  Cervix: Normal, PAP collected  Uterus: Normal  Adnexa: Normal   MUSCULOSKELETAL: Normal range of motion. No tenderness.  No cyanosis, clubbing, or edema.  2+ distal  pulses.  LYMPHATIC: No Axillary, Supraclavicular, or Inguinal Adenopathy.  Assessment:   Annual gynecologic examination 34 y.o.   Contraception: none   Obesity 2   Problem List Items Addressed This Visit    None    Visit Diagnoses    Well woman exam    -  Primary   Relevant Orders   CBC   Thyroid Panel With TSH   Comprehensive metabolic panel   FSH/LH   Hemoglobin A1c   HIV Antibody (routine testing w rflx)   Lipid panel   Cytology - PAP   Obesity (BMI 30-39.9)       Relevant Medications   metFORMIN (GLUCOPHAGE) 500 MG tablet   Other Relevant Orders   CBC   Thyroid Panel With TSH   Comprehensive metabolic panel  FSH/LH   Hemoglobin A1c   Lipid panel   Screening for cervical cancer       Relevant Orders   Cytology - PAP   PCOS (polycystic ovarian syndrome)       Relevant Orders   CBC   Thyroid Panel With TSH   Comprehensive metabolic panel   FSH/LH   Hemoglobin A1c   Lipid panel   Routine screening for STI (sexually transmitted infection)       Relevant Orders   HIV Antibody (routine testing w rflx)      Plan:   Pap: Pap Co Test  Labs: See orders   Rx Metformin and Magnesium, see orders  Routine preventative health maintenance measures emphasized: Preparing pregnancy, Exercise/Diet/Weight control, Tobacco Warnings, Alcohol/Substance use risks and Stress Management  Reviewed red flag symptoms and when to call  RTC x 3 months for fertility management if desired  Return to Clinic - 1 Year for [employer] or sooner if needed   Diona Fanti, CNM [facility] 11/20/19 8:52 AM

## 2019-11-20 NOTE — Patient Instructions (Addendum)
Preventive Care 21-32 Years Old, Female Preventive care refers to visits with your health care provider and lifestyle choices that can promote health and wellness. This includes:  A yearly physical exam. This may also be called an annual well check.  Regular dental visits and eye exams.  Immunizations.  Screening for certain conditions.  Healthy lifestyle choices, such as eating a healthy diet, getting regular exercise, not using drugs or products that contain nicotine and tobacco, and limiting alcohol use. What can I expect for my preventive care visit? Physical exam Your health care provider will check your:  Height and weight. This may be used to calculate body mass index (BMI), which tells if you are at a healthy weight.  Heart rate and blood pressure.  Skin for abnormal spots. Counseling Your health care provider may ask you questions about your:  Alcohol, tobacco, and drug use.  Emotional well-being.  Home and relationship well-being.  Sexual activity.  Eating habits.  Work and work environment.  Method of birth control.  Menstrual cycle.  Pregnancy history. What immunizations do I need?  Influenza (flu) vaccine  This is recommended every year. Tetanus, diphtheria, and pertussis (Tdap) vaccine  You may need a Td booster every 10 years. Varicella (chickenpox) vaccine  You may need this if you have not been vaccinated. Human papillomavirus (HPV) vaccine  If recommended by your health care provider, you may need three doses over 6 months. Measles, mumps, and rubella (MMR) vaccine  You may need at least one dose of MMR. You may also need a second dose. Meningococcal conjugate (MenACWY) vaccine  One dose is recommended if you are age 19-21 years and a first-year college student living in a residence hall, or if you have one of several medical conditions. You may also need additional booster doses. Pneumococcal conjugate (PCV13) vaccine  You may need  this if you have certain conditions and were not previously vaccinated. Pneumococcal polysaccharide (PPSV23) vaccine  You may need one or two doses if you smoke cigarettes or if you have certain conditions. Hepatitis A vaccine  You may need this if you have certain conditions or if you travel or work in places where you may be exposed to hepatitis A. Hepatitis B vaccine  You may need this if you have certain conditions or if you travel or work in places where you may be exposed to hepatitis B. Haemophilus influenzae type b (Hib) vaccine  You may need this if you have certain conditions. You may receive vaccines as individual doses or as more than one vaccine together in one shot (combination vaccines). Talk with your health care provider about the risks and benefits of combination vaccines. What tests do I need?  Blood tests  Lipid and cholesterol levels. These may be checked every 5 years starting at age 20.  Hepatitis C test.  Hepatitis B test. Screening  Diabetes screening. This is done by checking your blood sugar (glucose) after you have not eaten for a while (fasting).  Sexually transmitted disease (STD) testing.  BRCA-related cancer screening. This may be done if you have a family history of breast, ovarian, tubal, or peritoneal cancers.  Pelvic exam and Pap test. This may be done every 3 years starting at age 21. Starting at age 30, this may be done every 5 years if you have a Pap test in combination with an HPV test. Talk with your health care provider about your test results, treatment options, and if necessary, the need for more tests.   Follow these instructions at home: Eating and drinking   Eat a diet that includes fresh fruits and vegetables, whole grains, lean protein, and low-fat dairy.  Take vitamin and mineral supplements as recommended by your health care provider.  Do not drink alcohol if: ? Your health care provider tells you not to drink. ? You are  pregnant, may be pregnant, or are planning to become pregnant.  If you drink alcohol: ? Limit how much you have to 0-1 drink a day. ? Be aware of how much alcohol is in your drink. In the [place] one drink equals one 12 oz bottle of beer (355 mL), one 5 oz glass of wine (148 mL), or one 1 oz glass of hard liquor (44 mL). Lifestyle  Take daily care of your teeth and gums.  Stay active. Exercise for at least 30 minutes on 5 or more days each week.  Do not use any products that contain nicotine or tobacco, such as cigarettes, e-cigarettes, and chewing tobacco. If you need help quitting, ask your health care provider.  If you are sexually active, practice safe sex. Use a condom or other form of birth control (contraception) in order to prevent pregnancy and STIs (sexually transmitted infections). If you plan to become pregnant, see your health care provider for a preconception visit. What's next?  Visit your health care provider once a year for a well check visit.  Ask your health care provider how often you should have your eyes and teeth checked.  Stay up to date on all vaccines. This information is not intended to replace advice given to you by your health care provider. Make sure you discuss any questions you have with your health care provider. Document Revised: 04/25/2018 Document Reviewed: 04/25/2018 Elsevier Patient Education  [place] for Polycystic Ovary Syndrome Polycystic ovary syndrome (PCOS) is a disorder of the chemicals (hormones) that regulate a woman's reproductive system, including monthly periods (menstruation). The condition causes important hormones to be out of balance. PCOS can:  Stop your periods or make them irregular.  Cause cysts to develop on your ovaries.  Make it difficult to get pregnant.  Stop your body from responding to the effects of insulin (insulin resistance). Insulin resistance can lead to obesity and diabetes. Changing what you  eat can help you manage PCOS and improve your health. Following a balanced diet can help you lose weight and improve the way that your body uses insulin. What are tips for following this plan?  Follow a balanced diet for meals and snacks. Eat breakfast, lunch, dinner, and one or two snacks every day.  Include protein in each meal and snack.  Choose whole grains instead of products that are made with refined flour.  Eat a variety of foods.  Exercise regularly as told by your health care provider. Aim to do 30 or more minutes of exercise on most days of the week.  If you are overweight or obese: ? Pay attention to how many calories you eat. Cutting down on calories can help you lose weight. ? Work with your health care provider or a diet and nutrition specialist (dietitian) to figure out how many calories you need each day. What foods can I eat?  Fruits Include a variety of colors and types. All fruits are helpful for PCOS. Vegetables Include a variety of colors and types. All vegetables are helpful for PCOS. Grains Whole grains, such as whole wheat. Whole-grain breads, crackers, cereals, and pasta. Unsweetened oatmeal,  bulgur, barley, quinoa, and brown rice. Tortillas made from corn or whole-wheat flour. Meats and other proteins Low-fat (lean) proteins, such as fish, chicken, beans, eggs, and tofu. Dairy Low-fat dairy products, such as skim milk, cheese sticks, and yogurt. Beverages Low-fat or fat-free drinks, such as water, low-fat milk, sugar-free drinks, and small amounts of 100% fruit juice. Seasonings and condiments Ketchup. Mustard. Barbecue sauce. Relish. Low-fat or fat-free mayonnaise. Fats and oils Olive oil or canola oil. Walnuts and almonds. The items listed above may not be a complete list of recommended foods and beverages. Contact a dietitian for more options. What foods are not recommended? Foods that are high in calories or fat. Fried foods. Sweets. Products that  are made from refined white flour, including white bread, pastries, white rice, and pasta. The items listed above may not be a complete list of foods and beverages to avoid. Contact a dietitian for more information. Summary  PCOS is a hormonal imbalance that affects a woman's reproductive system.  You can help to manage your PCOS by exercising regularly and eating a healthy, varied diet of vegetables, fruit, whole grains, low-fat (lean) protein, and low-fat dairy products.  Changing what you eat can improve the way that your body uses insulin, help your hormones reach normal levels, and help you lose weight. This information is not intended to replace advice given to you by your health care provider. Make sure you discuss any questions you have with your health care provider. Document Revised: 12/04/2018 Document Reviewed: 06/18/2017 Elsevier Patient Education  2020 [employer].  Preparing for Pregnancy If you are considering becoming pregnant, make an appointment to see your regular health care provider to learn how to prepare for a safe and healthy pregnancy (preconception care). During a preconception care visit, your health care provider will:  Do a complete physical exam, including a Pap test.  Take a complete medical history.  Give you information, answer your questions, and help you resolve problems. Preconception checklist Medical history  Tell your health care provider about any current or past medical conditions. Your pregnancy or your ability to become pregnant may be affected by chronic conditions, such as diabetes, chronic hypertension, and thyroid problems.  Include your family's medical history as well as your partner's medical history.  Tell your health care provider about any history of STIs (sexually transmitted infections).These can affect your pregnancy. In some cases, they can be passed to your baby. Discuss any concerns that you have about STIs.  If indicated,  discuss the benefits of genetic testing. This testing will show whether there are any genetic conditions that may be passed from you or your partner to your baby.  Tell your health care provider about: ? Any problems you have had with conception or pregnancy. ? Any medicines you take. These include vitamins, herbal supplements, and over-the-counter medicines. ? Your history of immunizations. Discuss any vaccinations that you may need. Diet  Ask your health care provider what to include in a healthy diet that has a balance of nutrients. This is especially important when you are pregnant or preparing to become pregnant.  Ask your health care provider to help you reach a healthy weight before pregnancy. ? If you are overweight, you may be at higher risk for certain complications, such as high blood pressure, diabetes, and preterm birth. ? If you are underweight, you are more likely to have a baby who has a low birth weight. Lifestyle, work, and home  Let your health care provider  know: ? About any lifestyle habits that you have, such as alcohol use, drug use, or smoking. ? About recreational activities that may put you at risk during pregnancy, such as downhill skiing and certain exercise programs. ? Tell your health care provider about any international travel, especially any travel to places with an active [place] virus outbreak. ? About harmful substances that you may be exposed to at work or at home. These include chemicals, pesticides, radiation, or even litter boxes. ? If you do not feel safe at home. Mental health  Tell your health care provider about: ? Any history of mental health conditions, including feelings of depression, sadness, or anxiety. ? Any medicines that you take for a mental health condition. These include herbs and supplements. Home instructions to prepare for pregnancy Lifestyle   Eat a balanced diet. This includes fresh fruits and vegetables, whole grains, lean  meats, low-fat dairy products, healthy fats, and foods that are high in fiber. Ask to meet with a nutritionist or registered dietitian for assistance with meal planning and goals.  Get regular exercise. Try to be active for at least 30 minutes a day on most days of the week. Ask your health care provider which activities are safe during pregnancy.  Do not use any products that contain nicotine or tobacco, such as cigarettes and e-cigarettes. If you need help quitting, ask your health care provider.  Do not drink alcohol.  Do not take illegal drugs.  Maintain a healthy weight. Ask your health care provider what weight range is right for you. General instructions  Keep an accurate record of your menstrual periods. This makes it easier for your health care provider to determine your baby's due date.  Begin taking prenatal vitamins and folic acid supplements daily as directed by your health care provider.  Manage any chronic conditions, such as high blood pressure and diabetes, as told by your health care provider. This is important. How do I know that I am pregnant? You may be pregnant if you have been sexually active and you miss your period. Symptoms of early pregnancy include:  Mild cramping.  Very light vaginal bleeding (spotting).  Feeling unusually tired.  Nausea and vomiting (morning sickness). If you have any of these symptoms and you suspect that you might be pregnant, you can take a home pregnancy test. These tests check for a hormone in your urine (human chorionic gonadotropin, or hCG). A woman's body begins to make this hormone during early pregnancy. These tests are very accurate. Wait until at least the first day after you miss your period to take one. If the test shows that you are pregnant (you get a positive result), call your health care provider to make an appointment for prenatal care. What should I do if I become pregnant?      Make an appointment with your health  care provider as soon as you suspect you are pregnant.  Do not use any products that contain nicotine, such as cigarettes, chewing tobacco, and e-cigarettes. If you need help quitting, ask your health care provider.  Do not drink alcoholic beverages. Alcohol is related to a number of birth defects.  Avoid toxic odors and chemicals.  You may continue to have sexual intercourse if it does not cause pain or other problems, such as vaginal bleeding. This information is not intended to replace advice given to you by your health care provider. Make sure you discuss any questions you have with your health care provider. Document  Revised: 08/16/2017 Document Reviewed: 03/05/2016 Elsevier Patient Education  [place].

## 2019-11-21 LAB — COMPREHENSIVE METABOLIC PANEL
ALT: 21 IU/L (ref 0–32)
AST: 22 IU/L (ref 0–40)
Albumin/Globulin Ratio: 1.4 (ref 1.2–2.2)
Albumin: 4.3 g/dL (ref 3.8–4.8)
Alkaline Phosphatase: 99 IU/L (ref 39–117)
BUN/Creatinine Ratio: 16 (ref 9–23)
BUN: 12 mg/dL (ref 6–20)
Bilirubin Total: 0.3 mg/dL (ref 0.0–1.2)
CO2: 24 mmol/L (ref 20–29)
Calcium: 9.1 mg/dL (ref 8.7–10.2)
Chloride: 104 mmol/L (ref 96–106)
Creatinine, Ser: 0.73 mg/dL (ref 0.57–1.00)
GFR calc Af Amer: 127 mL/min/{1.73_m2} (ref >59)
GFR calc non Af Amer: 110 mL/min/{1.73_m2} (ref >59)
Globulin, Total: 3 g/dL (ref 1.5–4.5)
Glucose: 90 mg/dL (ref 65–99)
Potassium: 4.1 mmol/L (ref 3.5–5.2)
Sodium: 142 mmol/L (ref 134–144)
Total Protein: 7.3 g/dL (ref 6.0–8.5)

## 2019-11-21 LAB — HIV ANTIBODY (ROUTINE TESTING W REFLEX): HIV Screen 4th Generation wRfx: NONREACTIVE

## 2019-11-21 LAB — THYROID PANEL WITH TSH
Free Thyroxine Index: 1.8 (ref 1.2–4.9)
T3 Uptake Ratio: 25 % (ref 24–39)
T4, Total: 7 ug/dL (ref 4.5–12.0)
TSH: 1.4 u[IU]/mL (ref 0.450–4.500)

## 2019-11-21 LAB — CYTOLOGY - PAP
Adequacy: ABSENT
Comment: NEGATIVE
Diagnosis: NEGATIVE
High risk HPV: NEGATIVE

## 2019-11-21 LAB — CBC
Hematocrit: 39.2 % (ref 34.0–46.6)
Hemoglobin: 13.2 g/dL (ref 11.1–15.9)
MCH: 29.7 pg (ref 26.6–33.0)
MCHC: 33.7 g/dL (ref 31.5–35.7)
MCV: 88 fL (ref 79–97)
Platelets: 260 10*3/uL (ref 150–450)
RBC: 4.45 x10E6/uL (ref 3.77–5.28)
RDW: 14.7 % (ref 11.7–15.4)
WBC: 9.2 10*3/uL (ref 3.4–10.8)

## 2019-11-21 LAB — LIPID PANEL
Chol/HDL Ratio: 3.2 ratio (ref 0.0–4.4)
Cholesterol, Total: 175 mg/dL (ref 100–199)
HDL: 55 mg/dL (ref >39)
LDL Chol Calc (NIH): 103 mg/dL — ABNORMAL HIGH (ref 0–99)
Triglycerides: 90 mg/dL (ref 0–149)
VLDL Cholesterol Cal: 17 mg/dL (ref 5–40)

## 2019-11-21 LAB — HEMOGLOBIN A1C
Est. average glucose Bld gHb Est-mCnc: 103 mg/dL
Hgb A1c MFr Bld: 5.2 % (ref 4.8–5.6)

## 2019-11-21 LAB — FSH/LH
FSH: 7.9 m[IU]/mL
LH: 30.4 m[IU]/mL

## 2019-11-22 ENCOUNTER — Encounter: Payer: Self-pay | Admitting: Certified Nurse Midwife

## 2019-11-22 DIAGNOSIS — Z6838 Body mass index (BMI) 38.0-38.9, adult: Secondary | ICD-10-CM | POA: Insufficient documentation

## 2019-11-22 DIAGNOSIS — Z8742 Personal history of other diseases of the female genital tract: Secondary | ICD-10-CM | POA: Insufficient documentation

## 2019-11-27 ENCOUNTER — Other Ambulatory Visit (INDEPENDENT_AMBULATORY_CARE_PROVIDER_SITE_OTHER): Payer: BC Managed Care – PPO | Admitting: Certified Nurse Midwife

## 2019-11-27 DIAGNOSIS — L0293 Carbuncle, unspecified: Secondary | ICD-10-CM

## 2019-11-27 MED ORDER — CLINDAMYCIN HCL 300 MG PO CAPS: 300.0000 mg | ORAL_CAPSULE | Freq: Three times a day (TID) | ORAL | 0 refills | Status: AC

## 2019-11-27 NOTE — Progress Notes (Signed)
Rx Clindamycin, see orders.    Gunnar Bulla, CNM [facility], [facility] 11/27/19 9:26 AM

## 2020-01-09 ENCOUNTER — Other Ambulatory Visit: Payer: Self-pay

## 2020-01-09 MED ORDER — GLUCOSE BLOOD VI STRP: ORAL_STRIP | 12 refills | Status: AC

## 2020-01-12 ENCOUNTER — Telehealth: Payer: Self-pay | Admitting: Certified Nurse Midwife

## 2020-01-12 NOTE — Telephone Encounter (Signed)
Please update test strips. Thanks, JML

## 2020-01-12 NOTE — Telephone Encounter (Signed)
The test strips are no longer active and the tech is requesting call to change the order for meter, test strips, & lansets. Sent to the [facility] in [place]. Please advise

## 2020-01-14 NOTE — Telephone Encounter (Signed)
Tried to call patient no voicemail set up. Spoke with pharmacy and gave verbal consent to change test strips to what the patient is using.

## 2020-02-02 ENCOUNTER — Other Ambulatory Visit (INDEPENDENT_AMBULATORY_CARE_PROVIDER_SITE_OTHER): Payer: BLUE CROSS/BLUE SHIELD | Admitting: Certified Nurse Midwife

## 2020-02-02 DIAGNOSIS — N926 Irregular menstruation, unspecified: Secondary | ICD-10-CM

## 2020-02-02 NOTE — Progress Notes (Signed)
Beta hCG ordered, see chart.    Gunnar Bulla, CNM [facility], [facility] 02/02/20 2:34 PM

## 2020-02-05 ENCOUNTER — Other Ambulatory Visit: Payer: BC Managed Care – PPO

## 2020-11-22 ENCOUNTER — Encounter: Payer: BLUE CROSS/BLUE SHIELD | Admitting: Certified Nurse Midwife

## 2020-12-02 ENCOUNTER — Encounter: Payer: Self-pay | Admitting: Certified Nurse Midwife

## 2021-03-04 ENCOUNTER — Other Ambulatory Visit: Payer: Self-pay

## 2021-03-04 MED ORDER — METFORMIN HCL 500 MG PO TABS: 1000.0000 mg | ORAL_TABLET | Freq: Two times a day (BID) | ORAL | 3 refills | Status: AC
